# Patient Record
Sex: Male | Born: 1940 | Race: White | Hispanic: No | Marital: Married | Smoking: Former smoker
Health system: Southern US, Community
[De-identification: ages and names within clinical notes are randomized; demographics above are authoritative.]

## PROBLEM LIST (undated history)

## (undated) DIAGNOSIS — E785 Hyperlipidemia, unspecified: Secondary | ICD-10-CM

## (undated) DIAGNOSIS — E119 Type 2 diabetes mellitus without complications: Secondary | ICD-10-CM

## (undated) DIAGNOSIS — I10 Essential (primary) hypertension: Secondary | ICD-10-CM

## (undated) DIAGNOSIS — M1990 Unspecified osteoarthritis, unspecified site: Secondary | ICD-10-CM

## (undated) DIAGNOSIS — C4490 Unspecified malignant neoplasm of skin, unspecified: Secondary | ICD-10-CM

## (undated) DIAGNOSIS — K8689 Other specified diseases of pancreas: Secondary | ICD-10-CM

## (undated) HISTORY — DX: Unspecified malignant neoplasm of skin, unspecified: C44.90

## (undated) HISTORY — DX: Other specified diseases of pancreas: K86.89

## (undated) HISTORY — PX: TONSILLECTOMY AND ADENOIDECTOMY: SUR1326

## (undated) HISTORY — PX: LAMINECTOMY: SHX219

## (undated) HISTORY — PX: HEMORRHOID SURGERY: SHX153

---

## 2000-09-04 ENCOUNTER — Observation Stay (HOSPITAL_COMMUNITY): Admission: RE | Admit: 2000-09-04 | Discharge: 2000-09-05 | Payer: Self-pay | Admitting: Otolaryngology

## 2005-05-24 ENCOUNTER — Ambulatory Visit (HOSPITAL_COMMUNITY): Admission: RE | Admit: 2005-05-24 | Discharge: 2005-05-25 | Payer: Self-pay | Admitting: Neurological Surgery

## 2007-03-31 DIAGNOSIS — N529 Male erectile dysfunction, unspecified: Secondary | ICD-10-CM

## 2015-06-01 ENCOUNTER — Other Ambulatory Visit: Payer: Self-pay | Admitting: Family Medicine

## 2015-09-02 ENCOUNTER — Other Ambulatory Visit: Payer: Self-pay | Admitting: Family Medicine

## 2015-09-12 DIAGNOSIS — IMO0001 Reserved for inherently not codable concepts without codable children: Secondary | ICD-10-CM

## 2015-09-12 DIAGNOSIS — M48061 Spinal stenosis, lumbar region without neurogenic claudication: Secondary | ICD-10-CM | POA: Insufficient documentation

## 2015-09-12 DIAGNOSIS — H9313 Tinnitus, bilateral: Secondary | ICD-10-CM

## 2015-09-12 DIAGNOSIS — R351 Nocturia: Secondary | ICD-10-CM | POA: Insufficient documentation

## 2015-09-12 DIAGNOSIS — N400 Enlarged prostate without lower urinary tract symptoms: Secondary | ICD-10-CM | POA: Insufficient documentation

## 2015-09-12 DIAGNOSIS — E785 Hyperlipidemia, unspecified: Secondary | ICD-10-CM | POA: Insufficient documentation

## 2015-09-12 DIAGNOSIS — K648 Other hemorrhoids: Secondary | ICD-10-CM | POA: Insufficient documentation

## 2015-09-12 DIAGNOSIS — E119 Type 2 diabetes mellitus without complications: Secondary | ICD-10-CM | POA: Insufficient documentation

## 2015-09-12 DIAGNOSIS — M47812 Spondylosis without myelopathy or radiculopathy, cervical region: Secondary | ICD-10-CM

## 2015-09-12 DIAGNOSIS — R030 Elevated blood-pressure reading, without diagnosis of hypertension: Secondary | ICD-10-CM

## 2015-09-13 ENCOUNTER — Ambulatory Visit (INDEPENDENT_AMBULATORY_CARE_PROVIDER_SITE_OTHER): Payer: Medicare Other | Admitting: Family Medicine

## 2015-09-13 ENCOUNTER — Encounter: Payer: Self-pay | Admitting: Family Medicine

## 2015-09-13 ENCOUNTER — Other Ambulatory Visit: Payer: Self-pay

## 2015-09-13 VITALS — BP 136/74 | HR 81 | Temp 97.6°F | Resp 18 | Wt 160.2 lb

## 2015-09-13 DIAGNOSIS — E785 Hyperlipidemia, unspecified: Secondary | ICD-10-CM | POA: Diagnosis not present

## 2015-09-13 DIAGNOSIS — R195 Other fecal abnormalities: Secondary | ICD-10-CM | POA: Diagnosis not present

## 2015-09-13 DIAGNOSIS — E119 Type 2 diabetes mellitus without complications: Secondary | ICD-10-CM

## 2015-09-13 DIAGNOSIS — S90221A Contusion of right lesser toe(s) with damage to nail, initial encounter: Secondary | ICD-10-CM

## 2015-09-13 DIAGNOSIS — M47812 Spondylosis without myelopathy or radiculopathy, cervical region: Secondary | ICD-10-CM | POA: Insufficient documentation

## 2015-09-13 NOTE — Progress Notes (Signed)
Subjective:    Patient ID: Jared Paul, male    DOB: 01-02-1941, 74 y.o.   MRN: [id number] Chief Complaint  Patient presents with  . Blood Sugar Problem    Elevated FBS  . Blood In Stools    Positive FOBT on 08/25/2015    HPI  This 74 year old male diabetic had the [employer] nurse come by his house and checked stool for blood. Received a report it was positive on 08-25-15. Denies hematemesis, melena or hematochezia. Occasional hard stool with left abdominal dull ache. Last colonoscopy was 11 years ago without significant anomaly. Has a history of hemorrhoidectomy in 1974 without recurrences. Also, notice blood sugars have been 130-217 the past month to 6 weeks. No polyuria or polydipsia. Still on the Onglyza 5 mg qd with Metformin 500 mg 1 q am and 2 q pm. Has appointment on 09-15-15 for eye exam. Some decrease in sensation in the tip of the second toe of the right foot. Thinks he may have bumped it recently.  History reviewed. No pertinent past medical history. Patient Active Problem List   Diagnosis Date Noted  . Arthritis of neck 09/13/2015  . Nocturia 09/12/2015  . Hemorrhoids, internal 09/12/2015  . Spinal stenosis, lumbar 09/12/2015  . Hyperlipidemia 09/12/2015  . Elevated blood pressure 09/12/2015  . Type 2 diabetes mellitus without complication 44/02/4741  . Cervical arthritis 09/12/2015  . Benign prostate hyperplasia 09/12/2015  . 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 ED (erectile dysfunction) of organic origin 03/31/2007   Past Surgical History  Procedure Laterality Date  . Hemorrhoid surgery    . Tonsillectomy and adenoidectomy    . Laminectomy     Family History  Problem Relation Age of Onset  . Diabetes Mother   . Hypertension Mother   . Stroke Mother   . Lung cancer Father   . Pancreatic cancer Father   . Diabetes Father   . Multiple sclerosis Brother   . Diabetes Brother   . Pneumonia Maternal Grandmother   . Pneumonia Maternal Grandfather   .  Heart failure Paternal Grandmother   . Healthy Brother    Allergies  Allergen Reactions  . Prednisone     ELEVATED BLOOD SUGAR   Current Outpatient Prescriptions on File Prior to Visit  Medication Sig Dispense Refill  . aspirin 81 MG tablet Take 81 mg by mouth daily.    . Cinnamon 500 MG capsule Take 2,000 mg by mouth daily.    Marland Kitchen doxazosin (CARDURA) 4 MG tablet Take 4 mg by mouth daily.    . meloxicam (MOBIC) 15 MG tablet Take 15 mg by mouth daily.    . metFORMIN (GLUMETZA) 500 MG (MOD) 24 hr tablet Take 500 mg by mouth daily with breakfast. TAKE 2 TABLETS EVERY EVENING    . Omega-3 Fatty Acids (FISH OIL) 1000 MG CAPS Take 4 capsules by mouth.    . ONGLYZA 5 MG TABS tablet TAKE ONE TABLET BY MOUTH ONCE DAILY 90 tablet 3  . simvastatin (ZOCOR) 20 MG tablet Take 20 mg by mouth daily.     No current facility-administered medications on file prior to visit.   Social History  Substance Use Topics  . Smoking status: Former Research scientist (life sciences)  . Smokeless tobacco: Never Used  . Alcohol Use: No   Review of Systems  Constitutional: Negative.   HENT: Negative.   Respiratory: Negative.   Cardiovascular: Negative.   Gastrointestinal:       Some slight constipation occasionally. Normally has 2-3  soft stools a day. No blood in stools but has a history of hemorrhoids.  Genitourinary:       Nocturia 1-2 times a night without hematuria or dysuria. Slight decrease in stream with hesitancy (start-and-stop flow).     BP 136/74 mmHg  Pulse 81  Temp(Src) 97.6 F (36.4 C) (Oral)  Resp 18  Wt 160 lb 3.2 oz (72.666 kg)  SpO2 98%  Objective:   Physical Exam  Constitutional: He is oriented to person, place, and time. He appears well-developed and well-nourished. No distress.  HENT:  Head: Normocephalic and atraumatic.  Right Ear: Hearing normal.  Left Ear: Hearing normal.  Nose: Nose normal.  Eyes: Conjunctivae and lids are normal. Right eye exhibits no discharge. Left eye exhibits no discharge. No  scleral icterus.  Neck:  Congenital cervical stiffness and tilt of head to the right. History of some arthritic changes on past x-rays.  Cardiovascular: Normal rate and regular rhythm.   Pulmonary/Chest: Effort normal. No respiratory distress.  Abdominal: Soft. Bowel sounds are normal. He exhibits no distension and no mass. There is no tenderness. There is no guarding.  Musculoskeletal: Normal range of motion.  Neurological: He is alert and oriented to person, place, and time.  Skin: Skin is intact. No lesion and no rash noted.  Psychiatric: He has a normal mood and affect. His speech is normal and behavior is normal. Thought content normal.      Assessment & Plan:   1. Occult blood in stools Positive stool test for occult blood. No hematochezia or abdominal pain. Will schedule for gastroenterology referral for colonoscopy. - Ambulatory referral to Gastroenterology  2. Type 2 diabetes mellitus without complication Tolerating Onglyza and Metformin without side effects. Some tingling in the tip of the right second toe suspected secondary to trauma. Will continue diabetic diet and check routine labs. - CBC with Differential/Platelet - Hemoglobin A1c  3. Hyperlipidemia Tolerating Zocor without side effects. Continue low fat diet and recheck labs. - COMPLETE METABOLIC PANEL WITH GFR - Lipid panel  4. Subungual hematoma of toe of right foot, initial encounter Recent trauma with a dark spot in the right second toenail. Observe for changes. Expect slow resolution over the next month. No pain today.

## 2015-09-15 ENCOUNTER — Encounter: Payer: Self-pay | Admitting: Family Medicine

## 2015-09-15 DIAGNOSIS — R1031 Right lower quadrant pain: Secondary | ICD-10-CM | POA: Diagnosis not present

## 2015-09-15 DIAGNOSIS — R1032 Left lower quadrant pain: Secondary | ICD-10-CM | POA: Diagnosis not present

## 2015-09-15 DIAGNOSIS — K5900 Constipation, unspecified: Secondary | ICD-10-CM | POA: Diagnosis not present

## 2015-09-15 DIAGNOSIS — E785 Hyperlipidemia, unspecified: Secondary | ICD-10-CM | POA: Diagnosis not present

## 2015-09-15 DIAGNOSIS — E119 Type 2 diabetes mellitus without complications: Secondary | ICD-10-CM | POA: Diagnosis not present

## 2015-09-15 DIAGNOSIS — R195 Other fecal abnormalities: Secondary | ICD-10-CM | POA: Diagnosis not present

## 2015-09-16 LAB — COMPREHENSIVE METABOLIC PANEL
A/G RATIO: 2.3 (ref 1.1–2.5)
ALK PHOS: 38 IU/L — AB (ref 39–117)
ALT: 17 IU/L (ref 0–44)
AST: 16 IU/L (ref 0–40)
Albumin: 4.5 g/dL (ref 3.5–4.8)
BILIRUBIN TOTAL: 0.9 mg/dL (ref 0.0–1.2)
BUN/Creatinine Ratio: 21 (ref 10–22)
BUN: 16 mg/dL (ref 8–27)
CO2: 23 mmol/L (ref 18–29)
Calcium: 9.4 mg/dL (ref 8.6–10.2)
Chloride: 100 mmol/L (ref 97–108)
Creatinine, Ser: 0.76 mg/dL (ref 0.76–1.27)
GFR calc Af Amer: 105 mL/min/{1.73_m2} (ref >59)
GFR calc non Af Amer: 91 mL/min/{1.73_m2} (ref >59)
GLOBULIN, TOTAL: 2 g/dL (ref 1.5–4.5)
Glucose: 238 mg/dL — ABNORMAL HIGH (ref 65–99)
POTASSIUM: 4.4 mmol/L (ref 3.5–5.2)
SODIUM: 139 mmol/L (ref 134–144)
Total Protein: 6.5 g/dL (ref 6.0–8.5)

## 2015-09-16 LAB — CBC WITH DIFFERENTIAL/PLATELET
BASOS: 0 %
Basophils Absolute: 0 10*3/uL (ref 0.0–0.2)
EOS (ABSOLUTE): 0.2 10*3/uL (ref 0.0–0.4)
EOS: 5 %
HEMATOCRIT: 45.2 % (ref 37.5–51.0)
Hemoglobin: 15.3 g/dL (ref 12.6–17.7)
IMMATURE GRANS (ABS): 0 10*3/uL (ref 0.0–0.1)
IMMATURE GRANULOCYTES: 0 %
LYMPHS: 24 %
Lymphocytes Absolute: 1.2 10*3/uL (ref 0.7–3.1)
MCH: 30.3 pg (ref 26.6–33.0)
MCHC: 33.8 g/dL (ref 31.5–35.7)
MCV: 90 fL (ref 79–97)
MONOS ABS: 0.4 10*3/uL (ref 0.1–0.9)
Monocytes: 9 %
NEUTROS ABS: 3 10*3/uL (ref 1.4–7.0)
NEUTROS PCT: 62 %
Platelets: 133 10*3/uL — ABNORMAL LOW (ref 150–379)
RBC: 5.05 x10E6/uL (ref 4.14–5.80)
RDW: 13.4 % (ref 12.3–15.4)
WBC: 4.9 10*3/uL (ref 3.4–10.8)

## 2015-09-16 LAB — LIPID PANEL
CHOLESTEROL TOTAL: 129 mg/dL (ref 100–199)
Chol/HDL Ratio: 2.5 ratio units (ref 0.0–5.0)
HDL: 51 mg/dL (ref >39)
LDL CALC: 56 mg/dL (ref 0–99)
TRIGLYCERIDES: 110 mg/dL (ref 0–149)
VLDL CHOLESTEROL CAL: 22 mg/dL (ref 5–40)

## 2015-09-16 LAB — HEMOGLOBIN A1C
Est. average glucose Bld gHb Est-mCnc: 174 mg/dL
Hgb A1c MFr Bld: 7.7 % — ABNORMAL HIGH (ref 4.8–5.6)

## 2015-09-19 ENCOUNTER — Telehealth: Payer: Self-pay

## 2015-09-19 NOTE — Telephone Encounter (Signed)
-----   Message from Margo Common, [place] sent at 09/16/2015  6:49 PM EDT ----- Blood tests normal except blood sugar high at 238 and Hgb A1C high at 7.7. Normal cholesterol levels and no liver or kidney disease. Continue Onglyza 5 mg qd and increase Metformin to 1000 mg BID. Should check FBS daily and schedule follow up appointment in a month.

## 2015-09-19 NOTE — Telephone Encounter (Signed)
Patient advised as directed below. Patient verbalized understanding and agrees with treatment plan. Patient scheduled for 1 month follow up appointment.

## 2015-10-04 ENCOUNTER — Encounter: Payer: Self-pay | Admitting: Family Medicine

## 2015-10-06 ENCOUNTER — Other Ambulatory Visit: Payer: Self-pay | Admitting: Family Medicine

## 2015-10-06 ENCOUNTER — Encounter: Payer: Self-pay | Admitting: *Deleted

## 2015-10-06 DIAGNOSIS — E785 Hyperlipidemia, unspecified: Secondary | ICD-10-CM

## 2015-10-07 ENCOUNTER — Ambulatory Visit: Payer: Medicare Other | Admitting: Anesthesiology

## 2015-10-07 ENCOUNTER — Ambulatory Visit
Admission: RE | Admit: 2015-10-07 | Discharge: 2015-10-07 | Disposition: A | Discharge status: Home / Self Care | Payer: Medicare Other | Source: Ambulatory Visit | Attending: Gastroenterology | Admitting: Gastroenterology

## 2015-10-07 ENCOUNTER — Encounter: Payer: Self-pay | Admitting: *Deleted

## 2015-10-07 ENCOUNTER — Other Ambulatory Visit: Payer: Self-pay | Admitting: Family Medicine

## 2015-10-07 ENCOUNTER — Encounter
Admission: RE | Disposition: A | Discharge status: Home / Self Care | Payer: Self-pay | Source: Ambulatory Visit | Attending: Gastroenterology

## 2015-10-07 DIAGNOSIS — I10 Essential (primary) hypertension: Secondary | ICD-10-CM | POA: Insufficient documentation

## 2015-10-07 DIAGNOSIS — Z800 Family history of malignant neoplasm of digestive organs: Secondary | ICD-10-CM | POA: Diagnosis not present

## 2015-10-07 DIAGNOSIS — M1990 Unspecified osteoarthritis, unspecified site: Secondary | ICD-10-CM | POA: Insufficient documentation

## 2015-10-07 DIAGNOSIS — Z7982 Long term (current) use of aspirin: Secondary | ICD-10-CM | POA: Diagnosis not present

## 2015-10-07 DIAGNOSIS — E119 Type 2 diabetes mellitus without complications: Secondary | ICD-10-CM | POA: Diagnosis not present

## 2015-10-07 DIAGNOSIS — K5730 Diverticulosis of large intestine without perforation or abscess without bleeding: Secondary | ICD-10-CM | POA: Insufficient documentation

## 2015-10-07 DIAGNOSIS — E785 Hyperlipidemia, unspecified: Secondary | ICD-10-CM | POA: Diagnosis not present

## 2015-10-07 DIAGNOSIS — K5790 Diverticulosis of intestine, part unspecified, without perforation or abscess without bleeding: Secondary | ICD-10-CM | POA: Diagnosis not present

## 2015-10-07 DIAGNOSIS — Z87891 Personal history of nicotine dependence: Secondary | ICD-10-CM | POA: Insufficient documentation

## 2015-10-07 DIAGNOSIS — R195 Other fecal abnormalities: Secondary | ICD-10-CM | POA: Diagnosis present

## 2015-10-07 DIAGNOSIS — Z888 Allergy status to other drugs, medicaments and biological substances status: Secondary | ICD-10-CM | POA: Insufficient documentation

## 2015-10-07 HISTORY — DX: Hyperlipidemia, unspecified: E78.5

## 2015-10-07 HISTORY — DX: Type 2 diabetes mellitus without complications: E11.9

## 2015-10-07 HISTORY — DX: Essential (primary) hypertension: I10

## 2015-10-07 HISTORY — PX: COLONOSCOPY WITH PROPOFOL: SHX5780

## 2015-10-07 LAB — GLUCOSE, CAPILLARY: GLUCOSE-CAPILLARY: 182 mg/dL — AB (ref 65–99)

## 2015-10-07 SURGERY — COLONOSCOPY WITH PROPOFOL
Anesthesia: General

## 2015-10-07 MED ORDER — MIDAZOLAM HCL 2 MG/2ML IJ SOLN
INTRAMUSCULAR | Status: DC | PRN
  Administered 2015-10-07: 1 mg via INTRAVENOUS

## 2015-10-07 MED ORDER — GLUCOSE BLOOD VI STRP: ORAL_STRIP | Status: AC

## 2015-10-07 MED ORDER — SODIUM CHLORIDE 0.9 % IV SOLN
INTRAVENOUS | Status: DC
  Administered 2015-10-07: 10:00:00 via INTRAVENOUS

## 2015-10-07 MED ORDER — FENTANYL CITRATE (PF) 100 MCG/2ML IJ SOLN
INTRAMUSCULAR | Status: DC | PRN
  Administered 2015-10-07: 50 ug via INTRAVENOUS

## 2015-10-07 MED ORDER — PROPOFOL 500 MG/50ML IV EMUL
INTRAVENOUS | Status: DC | PRN
  Administered 2015-10-07: 120 ug/kg/min via INTRAVENOUS

## 2015-10-07 NOTE — Transfer of Care (Signed)
Immediate Anesthesia Transfer of Care Note  Patient: Jared Paul  Procedure(s) Performed: Procedure(s): COLONOSCOPY WITH PROPOFOL (N/A)  Patient Location: PACU  Anesthesia Type:General  Level of Consciousness: awake and sedated  Airway & Oxygen Therapy: Patient Spontanous Breathing and Patient connected to nasal cannula oxygen  Post-op Assessment: Report given to RN and Post -op Vital signs reviewed and stable  Post vital signs: Reviewed and stable  Last Vitals:  Filed Vitals:   10/07/15 0959  BP: 162/79  Pulse: 70  Temp: 36.8 C  Resp: 10    Complications: No apparent anesthesia complications

## 2015-10-07 NOTE — Discharge Instructions (Signed)

## 2015-10-07 NOTE — Op Note (Signed)
[facility] Patient Name: Jared Paul Procedure Date: 10/07/2015 10:36 AM MRN: [id number] Account #: [id number] Date of Birth: [birth year] Admit Type: Outpatient Age: 74 Room: [facility] ENDO ROOM 2 Gender: Male Note Status: Finalized Procedure:         Colonoscopy Indications:       Last colonoscopy 10 years ago, Heme positive stool Patient Profile:   This is a 74 year old male. Providers:         Gerrit Heck. Rayann Heman, MD Referring MD:      Guadalupe Maple, MD (Referring MD) Medicines:         Propofol per Anesthesia Complications:     No immediate complications. Procedure:         Pre-Anesthesia Assessment:                    - Prior to the procedure, a History and Physical was                     performed, and patient medications, allergies and                     sensitivities were reviewed. The patient's tolerance of                     previous anesthesia was reviewed.                    After obtaining informed consent, the colonoscope was                     passed under direct vision. Throughout the procedure, the                     patient's blood pressure, pulse, and oxygen saturations                     were monitored continuously. The Colonoscope was                     introduced through the anus and advanced to the the cecum,                     identified by appendiceal orifice and ileocecal valve. The                     colonoscopy was performed without difficulty. The patient                     tolerated the procedure well. The quality of the bowel                     preparation was excellent. Findings:      The perianal and digital rectal examinations were normal.      A few small and large-mouthed diverticula were found in the sigmoid       colon.      The exam was otherwise without abnormality on direct and retroflexion       views. Impression:        - Diverticulosis in the sigmoid colon.                    - The examination  was otherwise normal on direct and  retroflexion views.                    - No specimens collected. Recommendation:    - Observe patient in GI recovery unit.                    - High fiber diet.                    - Continue present medications.                    - Return to referring physician.                    - Consider stool cards starting in 5 years but would not                     plan for further screening colonoscopies.                    - The findings and recommendations were discussed with the                     patient.                    - The findings and recommendations were discussed with the                     patient's family. Procedure Code(s): --- Professional ---                    [id number], Colonoscopy, flexible; diagnostic, including                     collection of specimen(s) by brushing or washing, when                     performed (separate procedure) CPT copyright 2014 American Medical Association. All rights reserved. The codes documented in this report are preliminary and upon coder review may  be revised to meet current compliance requirements. Mellody Life, MD 10/07/2015 11:04:57 AM This report has been signed electronically. Number of Addenda: 0 Note Initiated On: 10/07/2015 10:36 AM Scope Withdrawal Time: 0 hours 11 minutes 15 seconds  Total Procedure Duration: 0 hours 18 minutes 14 seconds       [facility]

## 2015-10-07 NOTE — H&P (Signed)
Primary Care Physician:  Vernie Murders, PA  Pre-Procedure History & Physical: HPI:  Jared Paul is a 74 y.o. male is here for an colonoscopy.   Past Medical History  Diagnosis Date  . Diabetes mellitus without complication ([place])   . Hypertension   . Hyperlipidemia     Past Surgical History  Procedure Laterality Date  . Hemorrhoid surgery    . Tonsillectomy and adenoidectomy    . Laminectomy      Prior to Admission medications   Medication Sig Start Date End Date Taking? Authorizing Provider  Cinnamon 500 MG capsule Take 2,000 mg by mouth daily.   Yes Historical Provider, MD  doxazosin (CARDURA) 4 MG tablet Take 4 mg by mouth daily.   Yes Historical Provider, MD  meloxicam (MOBIC) 15 MG tablet Take 15 mg by mouth daily.   Yes Historical Provider, MD  metFORMIN (GLUMETZA) 500 MG (MOD) 24 hr tablet Take 500 mg by mouth daily with breakfast. TAKE 2 TABLETS EVERY EVENING   Yes Historical Provider, MD  Omega-3 Fatty Acids (FISH OIL) 1000 MG CAPS Take 4 capsules by mouth.   Yes Historical Provider, MD  ONGLYZA 5 MG TABS tablet TAKE ONE TABLET BY MOUTH ONCE DAILY 09/02/15  Yes Vickki Muff Chrismon, PA  simvastatin (ZOCOR) 20 MG tablet Take 1 tablet by mouth  every day 10/07/15  Yes Dennis E Chrismon, PA  aspirin 81 MG tablet Take 81 mg by mouth daily.    Historical Provider, MD  chlorpheniramine (ALLERGY) 4 MG tablet Take 1 tablet by mouth daily.    Historical Provider, MD  glucose blood (ACCU-CHEK AVIVA PLUS) test strip Use as instructed and check blood sugar once a day 10/07/15   Vickki Muff Chrismon, PA    Allergies as of 09/20/2015 - Review Complete 09/13/2015  Allergen Reaction Noted  . Prednisone  09/12/2015    Family History  Problem Relation Age of Onset  . Diabetes Mother   . Hypertension Mother   . Stroke Mother   . Lung cancer Father   . Pancreatic cancer Father   . Diabetes Father   . Multiple sclerosis Brother   . Diabetes Brother   . Pneumonia Maternal Grandmother    . Pneumonia Maternal Grandfather   . Heart failure Paternal Grandmother   . Healthy Brother     Social History   Social History  . Marital Status: Married    Spouse Name: N/A  . Number of Children: N/A  . Years of Education: N/A   Occupational History  . Not on file.   Social History Main Topics  . Smoking status: Former Research scientist (life sciences)  . Smokeless tobacco: Never Used  . Alcohol Use: No  . Drug Use: No  . Sexual Activity: Not on file   Other Topics Concern  . Not on file   Social History Narrative     Physical Exam: BP 162/79 mmHg  Pulse 70  Temp(Src) 98.3 F (36.8 C) (Tympanic)  Resp 10  Ht 5\' 10"  (1.778 m)  Wt 70.308 kg (155 lb)  BMI 22.24 kg/m2  SpO2 100% General:   Alert,  pleasant and cooperative in NAD Head:  Normocephalic and atraumatic. Neck:  Supple; no masses or thyromegaly. Lungs:  Clear throughout to auscultation.    Heart:  Regular rate and rhythm. Abdomen:  Soft, nontender and nondistended. Normal bowel sounds, without guarding, and without rebound.   Neurologic:  Alert and  oriented x4;  grossly normal neurologically.  Impression/Plan: Jared Paul is  here for an colonoscopy to be performed for + FOBT  Risks, benefits, limitations, and alternatives regarding  colonoscopy have been reviewed with the patient.  Questions have been answered.  All parties agreeable.   Josefine Class, MD  10/07/2015, 10:36 AM

## 2015-10-07 NOTE — Anesthesia Procedure Notes (Signed)
Performed by: COOK-MARTIN, Sada Mazzoni Pre-anesthesia Checklist: Patient identified, Emergency Drugs available, Suction available, Patient being monitored and Timeout performed Patient Re-evaluated:Patient Re-evaluated prior to inductionOxygen Delivery Method: Nasal cannula Preoxygenation: Pre-oxygenation with 100% oxygen Intubation Type: IV induction Airway Equipment and Method: Bite block Placement Confirmation: CO2 detector and positive ETCO2     

## 2015-10-07 NOTE — Anesthesia Postprocedure Evaluation (Signed)
  Anesthesia Post-op Note  Patient: Jared Paul  Procedure(s) Performed: Procedure(s): COLONOSCOPY WITH PROPOFOL (N/A)  Anesthesia type:General  Patient location: PACU  Post pain: Pain level controlled  Post assessment: Post-op Vital signs reviewed, Patient's Cardiovascular Status Stable, Respiratory Function Stable, Patent Airway and No signs of Nausea or vomiting  Post vital signs: Reviewed and stable  Last Vitals:  Filed Vitals:   10/07/15 1150  BP:   Pulse: 63  Temp:   Resp: 15    Level of consciousness: awake, alert  and patient cooperative  Complications: No apparent anesthesia complications

## 2015-10-07 NOTE — Anesthesia Preprocedure Evaluation (Signed)
Anesthesia Evaluation  Patient identified by MRN, date of birth, ID band Patient awake    Reviewed: Allergy & Precautions, H&P , NPO status , Patient's Chart, lab work & pertinent test results  History of Anesthesia Complications Negative for: history of anesthetic complications  Airway Mallampati: III  TM Distance: >3 FB Neck ROM: limited    Dental  (+) Poor Dentition, Missing, Partial Lower   Pulmonary neg shortness of breath, former smoker,    Pulmonary exam normal breath sounds clear to auscultation       Cardiovascular Exercise Tolerance: Good hypertension, (-) Past MI Normal cardiovascular exam Rhythm:regular Rate:Normal     Neuro/Psych negative neurological ROS  negative psych ROS   GI/Hepatic negative GI ROS, Neg liver ROS,   Endo/Other  diabetes, Type 2, Oral Hypoglycemic Agents  Renal/GU negative Renal ROS  negative genitourinary   Musculoskeletal  (+) Arthritis ,   Abdominal   Peds  Hematology negative hematology ROS (+)   Anesthesia Other Findings Past Medical History:   Diabetes mellitus without complication (HCC)                 Hypertension                                                 Hyperlipidemia                                               Reproductive/Obstetrics negative OB ROS                             Anesthesia Physical Anesthesia Plan  ASA: III  Anesthesia Plan: General   Post-op Pain Management:    Induction:   Airway Management Planned:   Additional Equipment:   Intra-op Plan:   Post-operative Plan:   Informed Consent: I have reviewed the patients History and Physical, chart, labs and discussed the procedure including the risks, benefits and alternatives for the proposed anesthesia with the patient or authorized representative who has indicated his/her understanding and acceptance.   Dental Advisory Given  Plan Discussed with:  Anesthesiologist, CRNA and Surgeon  Anesthesia Plan Comments:         Anesthesia Quick Evaluation

## 2015-10-10 ENCOUNTER — Encounter: Payer: Self-pay | Admitting: Gastroenterology

## 2015-10-12 DIAGNOSIS — G4730 Sleep apnea, unspecified: Secondary | ICD-10-CM | POA: Insufficient documentation

## 2015-10-20 ENCOUNTER — Ambulatory Visit (INDEPENDENT_AMBULATORY_CARE_PROVIDER_SITE_OTHER): Payer: Medicare Other | Admitting: Family Medicine

## 2015-10-20 ENCOUNTER — Other Ambulatory Visit: Payer: Self-pay

## 2015-10-20 ENCOUNTER — Encounter: Payer: Self-pay | Admitting: Family Medicine

## 2015-10-20 VITALS — BP 118/64 | HR 90 | Temp 97.9°F | Resp 16 | Wt 160.8 lb

## 2015-10-20 DIAGNOSIS — E119 Type 2 diabetes mellitus without complications: Secondary | ICD-10-CM

## 2015-10-20 DIAGNOSIS — Z23 Encounter for immunization: Secondary | ICD-10-CM

## 2015-10-20 DIAGNOSIS — M4692 Unspecified inflammatory spondylopathy, cervical region: Secondary | ICD-10-CM

## 2015-10-20 DIAGNOSIS — M47812 Spondylosis without myelopathy or radiculopathy, cervical region: Secondary | ICD-10-CM

## 2015-10-20 MED ORDER — PIOGLITAZONE HCL 15 MG PO TABS: 15.0000 mg | ORAL_TABLET | Freq: Every day | ORAL | Status: DC

## 2015-10-20 NOTE — Addendum Note (Signed)
Addended by: Jules Schick on: 10/20/2015 09:03 AM   Modules accepted: Orders

## 2015-10-20 NOTE — Progress Notes (Signed)
Patient ID: Jared Paul, male   DOB: October 09, 1941, 74 y.o.   MRN: [id number]    Subjective:  Diabetes He presents for his follow-up diabetic visit. He has type 2 diabetes mellitus. His disease course has been worsening. There are no hypoglycemic associated symptoms. Pertinent negatives for diabetes include no blurred vision, no polydipsia and no polyuria. There are no hypoglycemic complications. Symptoms are stable. Current diabetic treatment includes oral agent (dual therapy) and diet. He is compliant with treatment most of the time. His weight is decreasing steadily. He is following a diabetic diet. He participates in exercise intermittently. His breakfast blood glucose is taken between 5-6 am. His breakfast blood glucose range is generally >200 mg/dl. His overall blood glucose range is >200 mg/dl. Eye exam is current.     Prior to Admission medications   Medication Sig Start Date End Date Taking? Authorizing Provider  aspirin 81 MG tablet Take 81 mg by mouth daily.   Yes Historical Provider, MD  chlorpheniramine (ALLERGY) 4 MG tablet Take 1 tablet by mouth daily.   Yes Historical Provider, MD  Cinnamon 500 MG capsule Take 2,000 mg by mouth daily.   Yes Historical Provider, MD  doxazosin (CARDURA) 4 MG tablet Take 4 mg by mouth daily.   Yes Historical Provider, MD  GAVILYTE-N WITH FLAVOR PACK 420 G solution  10/05/15  Yes Historical Provider, MD  glucose blood (ACCU-CHEK AVIVA PLUS) test strip Use as instructed and check blood sugar once a day 10/07/15  Yes Dennis E Chrismon, PA  meloxicam (MOBIC) 15 MG tablet Take 15 mg by mouth daily.   Yes Historical Provider, MD  metFORMIN (GLUMETZA) 500 MG (MOD) 24 hr tablet Take 500 mg by mouth daily with breakfast. TAKE 2 TABLETS EVERY EVENING   Yes Historical Provider, MD  Omega-3 Fatty Acids (FISH OIL) 1000 MG CAPS Take 4 capsules by mouth.   Yes Historical Provider, MD  ONGLYZA 5 MG TABS tablet TAKE ONE TABLET BY MOUTH ONCE DAILY 09/02/15  Yes Vickki Muff  Chrismon, PA  simvastatin (ZOCOR) 20 MG tablet Take 1 tablet by mouth  every day 10/07/15  Yes Margo Common, PA   Past Surgical History  Procedure Laterality Date  . Hemorrhoid surgery    . Tonsillectomy and adenoidectomy    . Laminectomy    . Colonoscopy with propofol N/A 10/07/2015    Procedure: COLONOSCOPY WITH PROPOFOL;  Surgeon: Josefine Class, MD;  Location: [facility] ENDOSCOPY;  Service: Endoscopy;  Laterality: N/A;    Patient Active Problem List   Diagnosis Date Noted  . Apnea, sleep 10/12/2015  . Arthritis of neck ([place]) 09/13/2015  . Nocturia 09/12/2015  . Hemorrhoids, internal 09/12/2015  . Spinal stenosis, lumbar 09/12/2015  . Hyperlipidemia 09/12/2015  . Elevated blood pressure 09/12/2015  . Type 2 diabetes mellitus without complication ([place]) 85/88/5027  . Cervical arthritis ([place]) 09/12/2015  . Benign prostate hyperplasia 09/12/2015  . 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 Diabetes mellitus without complication ([place])   . Hypertension   . Hyperlipidemia     Social History   Social History  . Marital Status: Married    Spouse Name: N/A  . Number of Children: N/A  . Years of Education: N/A   Occupational History  . Not on file.   Social History Main Topics  . Smoking status: Former Research scientist (life sciences)  . Smokeless tobacco: Never Used  . Alcohol Use:  No  . Drug Use: No  . Sexual Activity: Not on file   Other Topics Concern  . Not on file   Social History Narrative    Allergies  Allergen Reactions  . Prednisone     ELEVATED BLOOD SUGAR    Review of Systems  Constitutional: Negative.   HENT: Negative.   Eyes: Negative.  Negative for blurred vision.  Respiratory: Negative.   Cardiovascular: Negative.   Gastrointestinal: Negative.   Genitourinary: Negative.   Musculoskeletal: Negative.   Skin: Negative.   Neurological: Negative.   Endo/Heme/Allergies:  Negative.  Negative for polydipsia.  Psychiatric/Behavioral: Negative.     Immunization History  Administered Date(s) Administered  . Pneumococcal Conjugate-13 11/12/2014  . Pneumococcal Polysaccharide-23 11/16/2013  . Td 05/28/2007  . Zoster 11/21/2012   Objective:  BP 118/64 mmHg  Pulse 90  Temp(Src) 97.9 F (36.6 C) (Oral)  Resp 16  Wt 160 lb 12.8 oz (72.938 kg)  SpO2 95%  Physical Exam  Constitutional: He is oriented to person, place, and time and well-developed, well-nourished, and in no distress.  HENT:  Head: Normocephalic.  Right Ear: External ear normal.  Left Ear: External ear normal.  Nose: Nose normal.  Eyes: Conjunctivae and EOM are normal.  Neck:  Stiff with very limited range of motion. Head tilted to the right. No pain or radiation today. No extremity weakness.  Neurological: He is alert and oriented to person, place, and time.  Psychiatric: Affect and judgment normal.    Lab Results  Component Value Date   WBC 4.9 09/15/2015   HCT 45.2 09/15/2015   GLUCOSE 238* 09/15/2015   CHOL 129 09/15/2015   TRIG 110 09/15/2015   HDL 51 09/15/2015   LDLCALC 56 09/15/2015   HGBA1C 7.7* 09/15/2015    CMP     Component Value Date/Time   NA 139 09/15/2015 0814   K 4.4 09/15/2015 0814   CL 100 09/15/2015 0814   CO2 23 09/15/2015 0814   GLUCOSE 238* 09/15/2015 0814   BUN 16 09/15/2015 0814   CREATININE 0.76 09/15/2015 0814   CALCIUM 9.4 09/15/2015 0814   PROT 6.5 09/15/2015 0814   ALBUMIN 4.5 09/15/2015 0814   AST 16 09/15/2015 0814   ALT 17 09/15/2015 0814   ALKPHOS 38* 09/15/2015 0814   BILITOT 0.9 09/15/2015 0814   GFRNONAA 91 09/15/2015 0814   GFRAA 105 09/15/2015 0814    Assessment and Plan :  1. Type 2 diabetes mellitus without complication, without long-term current use of insulin (HCC) Steady increase in Hgb A1C over the past year from 5.6 to 7.7. Having elevation of FBS to over 200 regularly now. Still on the Onglyza 5 mg qd and Metformin  500 mg 2 BID with diabetic diet. Denies hypoglycemic episodes, polyuria, polydipsia or vision changes. Will add Actos and recheck in a month.   2. Cervical arthritis (HCC) Stable and unchanged. Tolerating Meloxicam daily without side effect and controls pain very well. Recheck prn. [facility] 10/20/2015 8:10 AM

## 2015-11-21 ENCOUNTER — Encounter: Payer: Self-pay | Admitting: Family Medicine

## 2015-11-21 ENCOUNTER — Ambulatory Visit (INDEPENDENT_AMBULATORY_CARE_PROVIDER_SITE_OTHER): Payer: Medicare Other | Admitting: Family Medicine

## 2015-11-21 VITALS — BP 138/70 | HR 77 | Temp 97.8°F | Resp 16 | Wt 163.4 lb

## 2015-11-21 DIAGNOSIS — L02429 Furuncle of limb, unspecified: Secondary | ICD-10-CM

## 2015-11-21 DIAGNOSIS — E119 Type 2 diabetes mellitus without complications: Secondary | ICD-10-CM | POA: Diagnosis not present

## 2015-11-21 MED ORDER — CEPHALEXIN 500 MG PO CAPS: 500.0000 mg | ORAL_CAPSULE | Freq: Four times a day (QID) | ORAL | Status: DC

## 2015-11-21 MED ORDER — INSULIN GLARGINE 100 UNIT/ML SOLOSTAR PEN: 6.0000 [IU] | PEN_INJECTOR | Freq: Every day | SUBCUTANEOUS | Status: DC

## 2015-11-21 MED ORDER — INSULIN GLARGINE 100 UNIT/ML SOLOSTAR PEN: 10.0000 [IU] | PEN_INJECTOR | Freq: Every day | SUBCUTANEOUS | Status: DC

## 2015-11-21 NOTE — Progress Notes (Signed)
Patient ID: Jared Paul, male   DOB: 1941-07-06, 74 y.o.   MRN: [id number]   Chief Complaint  Patient presents with  . Diabetes  . Follow-up    Subjective:  Diabetes He presents for his follow-up diabetic visit. He has type 2 diabetes mellitus. His disease course has been fluctuating (despite adding Actos 15 mg qd to the Metformin 500 mg 2 qd and Onglyza 5 mg qd on 10-20-15). There are no hypoglycemic associated symptoms. There are no diabetic associated symptoms. There are no hypoglycemic complications. Risk factors for coronary artery disease include hypertension, diabetes mellitus and male sex. Current diabetic treatment includes diet and oral agent (triple therapy). He is compliant with treatment most of the time. His weight is increasing steadily. Meal planning includes avoidance of concentrated sweets and calorie counting. He participates in exercise intermittently. His home blood glucose trend is increasing steadily. His breakfast blood glucose is taken between 6-7 am. His breakfast blood glucose range is generally >200 mg/dl. Eye exam is current.    Prior to Admission medications   Medication Sig Start Date End Date Taking? Authorizing Provider  aspirin 81 MG tablet Take 81 mg by mouth daily.   Yes Historical Provider, MD  chlorpheniramine (ALLERGY) 4 MG tablet Take 1 tablet by mouth daily.   Yes Historical Provider, MD  Cinnamon 500 MG capsule Take 2,000 mg by mouth daily.   Yes Historical Provider, MD  doxazosin (CARDURA) 4 MG tablet Take 4 mg by mouth daily.   Yes Historical Provider, MD  GAVILYTE-N WITH FLAVOR PACK 420 G solution  10/05/15  Yes Historical Provider, MD  glucose blood (ACCU-CHEK AVIVA PLUS) test strip Use as instructed and check blood sugar once a day 10/07/15  Yes Dennis E Chrismon, PA  meloxicam (MOBIC) 15 MG tablet Take 15 mg by mouth daily.   Yes Historical Provider, MD  metFORMIN (GLUMETZA) 500 MG (MOD) 24 hr tablet Take 500 mg by mouth daily with breakfast.  TAKE 2 TABLETS EVERY EVENING   Yes Historical Provider, MD  Omega-3 Fatty Acids (FISH OIL) 1000 MG CAPS Take 4 capsules by mouth.   Yes Historical Provider, MD  ONGLYZA 5 MG TABS tablet TAKE ONE TABLET BY MOUTH ONCE DAILY 09/02/15  Yes Dennis E Chrismon, PA  pioglitazone (ACTOS) 15 MG tablet Take 1 tablet (15 mg total) by mouth daily. 10/20/15  Yes Vickki Muff Chrismon, PA  simvastatin (ZOCOR) 20 MG tablet Take 1 tablet by mouth  every day 10/07/15  Yes Margo Common, PA    Patient Active Problem List   Diagnosis Date Noted  . Apnea, sleep 10/12/2015  . 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 Nocturia 09/12/2015  . Hemorrhoids, internal 09/12/2015  . Spinal stenosis, lumbar 09/12/2015  . Hyperlipidemia 09/12/2015  . Elevated blood pressure 09/12/2015  . Type 2 diabetes mellitus without complication ([place]) [id number]  . Cervical arthritis ([place]) 09/12/2015  . Benign prostate hyperplasia 09/12/2015  . 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 Diabetes mellitus without complication ([place])   . Hypertension   . Hyperlipidemia     Social History   Social History  . Marital Status: Married    Spouse Name: N/A  . Number of Children: N/A  . Years of Education: N/A   Occupational History  . Not on file.   Social History Main Topics  . Smoking status: Former Research scientist (life sciences)  . Smokeless tobacco: Never  Used  . Alcohol Use: No  . Drug Use: No  . Sexual Activity: Not on file   Other Topics Concern  . Not on file   Social History Narrative    Allergies  Allergen Reactions  . Prednisone     ELEVATED BLOOD SUGAR   Past Surgical History  Procedure Laterality Date  . Hemorrhoid surgery    . Tonsillectomy and adenoidectomy    . Laminectomy    . Colonoscopy with propofol N/A 10/07/2015    Procedure: COLONOSCOPY WITH PROPOFOL;  Surgeon: Josefine Class, MD;  Location[facility] END[facility]COPY;  Service:  Endoscopy;  Laterality: N/A;    Review of Systems  Constitutional: Negative.   HENT: Negative.   Eyes: Negative.   Respiratory: Negative.   Cardiovascular: Negative.   Gastrointestinal: Negative.   Genitourinary: Negative.   Musculoskeletal: Negative.   Skin: Negative.   Neurological: Negative.   Endo/Heme/Allergies: Negative.   Psychiatric/Behavioral: Negative.     Immunization History  Administered Date(s) Administered  . Influenza, High Dose Seasonal PF 10/20/2015  . Pneumococcal Conjugate-13 11/12/2014  . Pneumococcal Polysaccharide-23 11/16/2013  . Td 05/28/2007  . Zoster 11/21/2012   Objective:  BP 138/70 mmHg  Pulse 77  Temp(Src) 97.8 F (36.6 C) (Oral)  Resp 16  Wt 163 lb 6.4 oz (74.118 kg)  SpO2 98% Wt Readings from Last 3 Encounters:  11/21/15 163 lb 6.4 oz (74.118 kg)  10/20/15 160 lb 12.8 oz (72.938 kg)  10/07/15 155 lb (70.308 kg)    Physical Exam  Constitutional: He is oriented to person, place, and time and well-developed, well-nourished, and in no distress.  HENT:  Head: Normocephalic.  Right Ear: External ear normal.  Left Ear: External ear normal.  Mouth/Throat: Oropharynx is clear and moist.  Eyes: Conjunctivae and EOM are normal.  Neck: No thyromegaly present.  Stiff and limited ROM  Cardiovascular: Normal rate, regular rhythm and normal heart sounds.   Pulmonary/Chest: Effort normal and breath sounds normal.  Abdominal: Soft. Bowel sounds are normal.  Neurological: He is alert and oriented to person, place, and time. He has normal reflexes.  Skin:  Small boil right anterior thigh with slight soreness and 1 cm bright redness. No drainage but a central greenish scab.  Psychiatric: Memory, affect and judgment normal.    Lab Results  Component Value Date   WBC 4.9 09/15/2015   HCT 45.2 09/15/2015   GLUCOSE 238* 09/15/2015   CHOL 129 09/15/2015   TRIG 110 09/15/2015   HDL 51 09/15/2015   LDLCALC 56 09/15/2015   HGBA1C 7.7* 09/15/2015     CMP     Component Value Date/Time   NA 139 09/15/2015 0814   K 4.4 09/15/2015 0814   CL 100 09/15/2015 0814   CO2 23 09/15/2015 0814   GLUCOSE 238* 09/15/2015 0814   BUN 16 09/15/2015 0814   CREATININE 0.76 09/15/2015 0814   CALCIUM 9.4 09/15/2015 0814   PROT 6.5 09/15/2015 0814   ALBUMIN 4.5 09/15/2015 0814   AST 16 09/15/2015 0814   ALT 17 09/15/2015 0814   ALKPHOS 38* 09/15/2015 0814   BILITOT 0.9 09/15/2015 0814   GFRNONAA 91 09/15/2015 0814   GFRAA 105 09/15/2015 0814    Assessment and Plan :  1. Type 2 diabetes mellitus without complication, without long-term current use of insulin (HCC) FBS continues to range from 200 to 240 each morning the past month on the Onglyza 5 mg qd, Metformin 500 mg 2 BID and Actos 15 mg qd. Will  add 6 units of Lantus q am and recheck in 2 weeks. Demonstrated first injection today in the office. Continue to check FBS daily before injection. - Insulin Glargine (LANTUS SOLOSTAR) 100 UNIT/ML Solostar Pen; Inject 6 Units into the skin daily at 10 pm.  Dispense: 5 pen; Refill: PRN  2. Furuncle of thigh Onset over the past week. Will treat with antibiotic and recheck in 2 weeks. Clean skin with antibacterial soaps. - cephALEXin (KEFLEX) 500 MG capsule; Take 1 capsule (500 mg total) by mouth 4 (four) times daily.  Dispense: 30 capsule; Refill: [facility] 11/21/2015 8:04 AM

## 2015-11-21 NOTE — Patient Instructions (Signed)
Insulin Glargine injection What is this medicine? INSULIN GLARGINE (IN su lin GLAR geen) is a human-made form of insulin. This drug lowers the amount of sugar in your blood. It is a long-acting insulin that is usually given once a day. This medicine may be used for other purposes; ask your health care provider or pharmacist if you have questions. What should I tell my health care provider before I take this medicine? They need to know if you have any of these conditions: -episodes of hypoglycemia -kidney disease -liver disease -an unusual or allergic reaction to insulin, metacresol, other medicines, foods, dyes, or preservatives -pregnant or trying to get pregnant -breast-feeding How should I use this medicine? This medicine is for injection under the skin. Use this medicine at the same time each day. Use exactly as directed. This insulin should never be mixed in the same syringe with other insulins before injection. Do not vigorously shake before use. You will be taught how to use this medicine and how to adjust doses for activities and illness. Do not use more insulin than prescribed. Always check the appearance of your insulin before using it. This medicine should be clear and colorless like water. Do not use it if it is cloudy, thickened, colored, or has solid particles in it. It is important that you put your used needles and syringes in a special sharps container. Do not put them in a trash can. If you do not have a sharps container, call your pharmacist or healthcare provider to get one. Talk to your pediatrician regarding the use of this medicine in children. Special care may be needed. Overdosage: If you think you have taken too much of this medicine contact a poison control center or emergency room at once. NOTE: This medicine is only for you. Do not share this medicine with others. What if I miss a dose? It is important not to miss a dose. Your health care professional or doctor should  discuss a plan for missed doses with you. If you do miss a dose, follow their plan. Do not take double doses. What may interact with this medicine? -other medicines for diabetes Many medications may cause an increase or decrease in blood sugar, these include: -alcohol containing beverages -aspirin and aspirin-like drugs -chloramphenicol -chromium -diuretics -male hormones, like estrogens or progestins and birth control pills -heart medicines -isoniazid -male hormones or anabolic steroids -medicines for weight loss -medicines for allergies, asthma, cold, or cough -medicines for mental problems -medicines called MAO Inhibitors like Nardil, Parnate, Marplan, Eldepryl -niacin -NSAIDs, medicines for pain and inflammation, like ibuprofen or naproxen -pentamidine -phenytoin -probenecid -quinolone antibiotics like ciprofloxacin, levofloxacin, ofloxacin -some herbal dietary supplements -steroid medicines like prednisone or cortisone -thyroid medicine Some medications can hide the warning symptoms of low blood sugar. You may need to monitor your blood sugar more closely if you are taking one of these medications. These include: -beta-blockers such as atenolol, metoprolol, propranolol -clonidine -guanethidine -reserpine This list may not describe all possible interactions. Give your health care provider a list of all the medicines, herbs, non-prescription drugs, or dietary supplements you use. Also tell them if you smoke, drink alcohol, or use illegal drugs. Some items may interact with your medicine. What should I watch for while using this medicine? Visit your health care professional or doctor for regular checks on your progress. Do not drive, use machinery, or do anything that needs mental alertness until you know how this medicine affects you. Alcohol may interfere with the effect   of this medicine. Avoid alcoholic drinks. A test called the HbA1C (A1C) will be monitored. This is a  simple blood test. It measures your blood sugar control over the last 2 to 3 months. You will receive this test every 3 to 6 months. Learn how to check your blood sugar. Learn the symptoms of low and high blood sugar and how to manage them. Always carry a quick-source of sugar with you in case you have symptoms of low blood sugar. Examples include hard sugar candy or glucose tablets. Make sure others know that you can choke if you eat or drink when you develop serious symptoms of low blood sugar, such as seizures or unconsciousness. They must get medical help at once. Tell your doctor or health care professional if you have high blood sugar. You might need to change the dose of your medicine. If you are sick or exercising more than usual, you might need to change the dose of your medicine. Do not skip meals. Ask your doctor or health care professional if you should avoid alcohol. Many nonprescription cough and cold products contain sugar or alcohol. These can affect blood sugar. Make sure that you have the right kind of syringe for the type of insulin you use. Try not to change the brand and type of insulin or syringe unless your health care professional or doctor tells you to. Switching insulin brand or type can cause dangerously high or low blood sugar. Always keep an extra supply of insulin, syringes, and needles on hand. Use a syringe one time only. Throw away syringe and needle in a closed container to prevent accidental needle sticks. Insulin pens and cartridges should never be shared. Even if the needle is changed, sharing may result in passing of viruses like hepatitis or HIV. Wear a medical ID bracelet or chain, and carry a card that describes your disease and details of your medicine and dosage times. What side effects may I notice from receiving this medicine? Side effects that you should report to your health care professional or doctor as soon as possible: -allergic reactions like skin rash,  itching or hives, swelling of the face, lips, or tongue -breathing problems -signs and symptoms of high blood sugar such as dizziness, dry mouth, dry skin, fruity breath, nausea, stomach pain, increased hunger or thirst, increased urination -signs and symptoms of low blood sugar such as feeling anxious, confusion, dizziness, increased hunger, unusually weak or tired, sweating, shakiness, cold, irritable, headache, blurred vision, fast heartbeat, loss of consciousness Side effects that usually do not require medical attention (report to your health care professional or doctor if they continue or are bothersome): -increase or decrease in fatty tissue under the skin due to overuse of a particular injection site -itching, burning, swelling, or rash at site where injected This list may not describe all possible side effects. Call your doctor for medical advice about side effects. You may report side effects to FDA at 1-800-FDA-1088. Where should I keep my medicine? Keep out of the reach of children. Store unopened vials in a refrigerator between 2 and 8 degrees C (36 and 46 degrees F). Do not freeze or use if the insulin has been frozen. Opened vials (vials currently in use) may be stored in the refrigerator or at room temperature, at approximately 25 degrees C (77 degrees F) or cooler. Keeping your insulin at room temperature decreases the amount of pain during injection. Once opened, your insulin can be used for 28 days. After 28 days,   the vial should be thrown away. Store Lantus Solostar Pens or Basaglar KwikPens in a refrigerator between 2 and 8 degrees C (36 and 46 degrees F) or at room temperature below 30 degrees C (86 degrees F). Do not freeze or use if the insulin has been frozen. Once opened, the pens should be kept at room temperature. Do not store in the refrigerator once opened. Once opened, the insulin can be used for 28 days. After 28 days, the Lantus Solostar Pen or Basaglar KwikPen should be  thrown away. Store Toujeo Solostar Pens in a refrigerator between 2 and 8 degrees C (36 and 46 degrees F). Do not freeze or use if the insulin has been frozen. Once opened, the pens should be kept at room temperature below 30 degrees C (86 degrees F). Do not store in the refrigerator once opened. Once opened, the insulin can be used for 42 days. After 42 days, the Toujeo Solostar Pen should be thrown away. Protect all insulin vials and pens from light and excessive heat. Throw away any unused medicine after the expiration date or after the specified time for room temperature storage has passed. NOTE: This sheet is a summary. It may not cover all possible information. If you have questions about this medicine, talk to your doctor, pharmacist, or health care provider.    2016, Elsevier/Gold Standard. (2014-12-27 10:12:42)  

## 2015-11-25 ENCOUNTER — Encounter: Payer: Self-pay | Admitting: Family Medicine

## 2015-11-25 ENCOUNTER — Ambulatory Visit (INDEPENDENT_AMBULATORY_CARE_PROVIDER_SITE_OTHER): Payer: Medicare Other | Admitting: Family Medicine

## 2015-11-25 VITALS — BP 120/58 | HR 79 | Temp 98.6°F | Resp 16 | Wt 166.0 lb

## 2015-11-25 DIAGNOSIS — L02429 Furuncle of limb, unspecified: Secondary | ICD-10-CM

## 2015-11-25 MED ORDER — CEPHALEXIN 500 MG PO CAPS: 500.0000 mg | ORAL_CAPSULE | Freq: Four times a day (QID) | ORAL | Status: DC

## 2015-11-25 NOTE — Progress Notes (Signed)
Patient: Jared Paul Male    DOB: 1941/03/29   74 y.o.   MRN: [id number] Visit Date: 11/25/2015  Today's Provider: Vernie Murders, PA   Chief Complaint  Patient presents with  . Follow-up   Subjective:    HPI Follow-up for Furuncle of thigh from 11/21/2015; started: cephALEXin (KEFLEX) 500 MG capsule; Take 1 capsule (500 mg total) by mouth 4 (four) times daily. Went to pick up the prescription at [employer] and did not have it when he got home. Has not found the bottle at home, in the car or turned in at [place]. Has not taken any antibiotic yet.  Past Surgical History  Procedure Laterality Date  . Hemorrhoid surgery    . Tonsillectomy and adenoidectomy    . Laminectomy    . Colonoscopy with propofol N/A 10/07/2015    Procedure: COLONOSCOPY WITH PROPOFOL;  Surgeon: Josefine Class, MD;  Location: [facility] ENDOSCOPY;  Service: Endoscopy;  Laterality: N/A;   Patient Active Problem List   Diagnosis Date Noted  . Apnea, sleep 10/12/2015  . 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 Nocturia 09/12/2015  . Hemorrhoids, internal 09/12/2015  . Spinal stenosis, lumbar 09/12/2015  . Hyperlipidemia 09/12/2015  . Elevated blood pressure 09/12/2015  . Type 2 diabetes mellitus without complication ([place]) [id number]  . Cervical arthritis ([place]) 09/12/2015  . Benign prostate hyperplasia 09/12/2015  . 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 Prednisone     ELEVATED BLOOD SUGAR   Previous Medications   ASPIRIN 81 MG TABLET    Take 81 mg by mouth daily.   CEPHALEXIN (KEFLEX) 500 MG CAPSULE    Take 1 capsule (500 mg total) by mouth 4 (four) times daily.   CHLORPHENIRAMINE (ALLERGY) 4 MG TABLET    Take 1 tablet by mouth daily.   CINNAMON 500 MG CAPSULE    Take 2,000 mg by mouth daily.   DOXAZOSIN (CARDURA) 4 MG TABLET    Take 4 mg by mouth daily.   GAVILYTE-N WITH FLAVOR PACK 420 G  SOLUTION       GLUCOSE BLOOD (ACCU-CHEK AVIVA PLUS) TEST STRIP    Use as instructed and check blood sugar once a day   INSULIN GLARGINE (LANTUS SOLOSTAR) 100 UNIT/ML SOLOSTAR PEN    Inject 6 Units into the skin daily at 10 pm.   MELOXICAM (MOBIC) 15 MG TABLET    Take 15 mg by mouth daily.   METFORMIN (GLUMETZA) 500 MG (MOD) 24 HR TABLET    Take 500 mg by mouth daily with breakfast. TAKE 2 TABLETS EVERY EVENING   OMEGA-3 FATTY ACIDS (FISH OIL) 1000 MG CAPS    Take 4 capsules by mouth.   ONGLYZA 5 MG TABS TABLET    TAKE ONE TABLET BY MOUTH ONCE DAILY   PIOGLITAZONE (ACTOS) 15 MG TABLET    Take 1 tablet (15 mg total) by mouth daily.   SIMVASTATIN (ZOCOR) 20 MG TABLET    Take 1 tablet by mouth  every day    Review of Systems  Constitutional: Negative for fever.  Skin:       Sore on thigh is worse, inflamed, burning, itching    Social History  Substance Use Topics  . Smoking status: Former Research scientist (life sciences)  . Smokeless tobacco: Never Used  . Alcohol Use: No   Objective:   BP 120/58 mmHg  Pulse 79  Temp(Src) 98.6  F (37 C) (Oral)  Resp 16  Wt 166 lb (75.297 kg)  SpO2 97%  Physical Exam  Constitutional: He appears well-developed and well-nourished.  Skin: There is erythema.  Increase in the size of the furuncle on the right anterior thigh. Tender with surrounding pinkness. No drainage from the central scab in a 2 cm area of bright erythema.      Assessment & Plan:     1. Furuncle of thigh Persistent without central fluctuance and with no drainage after puncture of scab with #11 scalpel. Recommend hot [place] soaks and refilled the Cephalexin. Recheck in 1 week. Sooner if any fever develops.       Vernie Murders, PA  [facility]

## 2015-12-05 ENCOUNTER — Ambulatory Visit (INDEPENDENT_AMBULATORY_CARE_PROVIDER_SITE_OTHER): Payer: Medicare Other | Admitting: Family Medicine

## 2015-12-05 ENCOUNTER — Encounter: Payer: Self-pay | Admitting: Family Medicine

## 2015-12-05 VITALS — BP 142/72 | HR 86 | Temp 98.0°F | Resp 16 | Wt 166.6 lb

## 2015-12-05 DIAGNOSIS — E119 Type 2 diabetes mellitus without complications: Secondary | ICD-10-CM

## 2015-12-05 DIAGNOSIS — L02429 Furuncle of limb, unspecified: Secondary | ICD-10-CM

## 2015-12-05 NOTE — Progress Notes (Signed)
Patient ID: Jared Paul, male   DOB: 02/27/1941, 74 y.o.   MRN: [id number]    Subjective:  Diabetes He presents for his follow-up diabetic visit. He has type 2 diabetes mellitus. His disease course has been fluctuating.     Prior to Admission medications   Medication Sig Start Date End Date Taking? Authorizing Provider  aspirin 81 MG tablet Take 81 mg by mouth daily.   Yes Historical Provider, MD  cephALEXin (KEFLEX) 500 MG capsule Take 1 capsule (500 mg total) by mouth 4 (four) times daily. 11/25/15  Yes Dennis E Chrismon, PA  chlorpheniramine (ALLERGY) 4 MG tablet Take 1 tablet by mouth daily.   Yes Historical Provider, MD  Cinnamon 500 MG capsule Take 2,000 mg by mouth daily.   Yes Historical Provider, MD  doxazosin (CARDURA) 4 MG tablet Take 4 mg by mouth daily.   Yes Historical Provider, MD  GAVILYTE-N WITH FLAVOR PACK 420 G solution  10/05/15  Yes Historical Provider, MD  glucose blood (ACCU-CHEK AVIVA PLUS) test strip Use as instructed and check blood sugar once a day 10/07/15  Yes Dennis E Chrismon, PA  Insulin Glargine (LANTUS SOLOSTAR) 100 UNIT/ML Solostar Pen Inject 6 Units into the skin daily at 10 pm. 11/21/15  Yes Dennis E Chrismon, PA  meloxicam (MOBIC) 15 MG tablet Take 15 mg by mouth daily.   Yes Historical Provider, MD  metFORMIN (GLUMETZA) 500 MG (MOD) 24 hr tablet Take 500 mg by mouth daily with breakfast. TAKE 2 TABLETS EVERY EVENING   Yes Historical Provider, MD  Omega-3 Fatty Acids (FISH OIL) 1000 MG CAPS Take 4 capsules by mouth.   Yes Historical Provider, MD  ONGLYZA 5 MG TABS tablet TAKE ONE TABLET BY MOUTH ONCE DAILY 09/02/15  Yes Dennis E Chrismon, PA  pioglitazone (ACTOS) 15 MG tablet Take 1 tablet (15 mg total) by mouth daily. 10/20/15  Yes Vickki Muff Chrismon, PA  simvastatin (ZOCOR) 20 MG tablet Take 1 tablet by mouth  every day 10/07/15  Yes Margo Common, PA   Past Surgical History  Procedure Laterality Date  . Hemorrhoid surgery    . Tonsillectomy and  adenoidectomy    . Laminectomy    . Colonoscopy with propofol N/A 10/07/2015    Procedure: COLONOSCOPY WITH PROPOFOL;  Surgeon: Josefine Class, MD;  Location: [facility] ENDOSCOPY;  Service: Endoscopy;  Laterality: N/A;   Family History  Problem Relation Age of Onset  . Diabetes Mother   . Hypertension Mother   . Stroke Mother   . Lung cancer Father   . Pancreatic cancer Father   . Diabetes Father   . Multiple sclerosis Brother   . Diabetes Brother   . Pneumonia Maternal Grandmother   . Pneumonia Maternal Grandfather   . Heart failure Paternal Grandmother   . Healthy Brother     Patient Active Problem List   Diagnosis Date Noted  . Apnea, sleep 10/12/2015  . 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 Nocturia 09/12/2015  . Hemorrhoids, internal 09/12/2015  . Spinal stenosis, lumbar 09/12/2015  . Hyperlipidemia 09/12/2015  . Elevated blood pressure 09/12/2015  . Type 2 diabetes mellitus without complication ([place]) [id number]  . Cervical arthritis ([place]) 09/12/2015  . Benign prostate hyperplasia 09/12/2015  . 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 Diabetes mellitus without complication ([place])   . Hypertension   . Hyperlipidemia     Social History  Social History  . Marital Status: Married    Spouse Name: N/A  . Number of Children: N/A  . Years of Education: N/A   Occupational History  . Not on file.   Social History Main Topics  . Smoking status: Former Research scientist (life sciences)  . Smokeless tobacco: Never Used  . Alcohol Use: No  . Drug Use: No  . Sexual Activity: Not on file   Other Topics Concern  . Not on file   Social History Narrative    Allergies  Allergen Reactions  . Prednisone     ELEVATED BLOOD SUGAR    Review of Systems  Constitutional: Negative.   HENT: Negative.   Eyes: Negative.   Respiratory: Negative.   Cardiovascular: Negative.   Gastrointestinal: Negative.     Genitourinary: Negative.   Musculoskeletal: Negative.   Skin: Negative.   Neurological: Negative.   Endo/Heme/Allergies: Negative.   Psychiatric/Behavioral: Negative.     Immunization History  Administered Date(s) Administered  . Influenza, High Dose Seasonal PF 10/20/2015  . Pneumococcal Conjugate-13 11/12/2014  . Pneumococcal Polysaccharide-23 11/16/2013  . Td 05/28/2007  . Zoster 11/21/2012   Objective:  BP 142/72 mmHg  Pulse 86  Temp(Src) 98 F (36.7 C) (Oral)  Resp 16  Wt 166 lb 9.6 oz (75.569 kg)  Physical Exam  Constitutional: He is oriented to person, place, and time and well-developed, well-nourished, and in no distress.  HENT:  Head: Normocephalic.  Eyes: Conjunctivae are normal.  Cardiovascular: Normal rate and regular rhythm.   Pulmonary/Chest: Effort normal and breath sounds normal.  Neurological: He is alert and oriented to person, place, and time.  Skin:  Right anterior thigh furuncle fading. No further redness or purulent drainage. No local lymphadenopathy.  Psychiatric: Affect normal.    Lab Results  Component Value Date   WBC 4.9 09/15/2015   HCT 45.2 09/15/2015   GLUCOSE 238* 09/15/2015   CHOL 129 09/15/2015   TRIG 110 09/15/2015   HDL 51 09/15/2015   LDLCALC 56 09/15/2015   HGBA1C 7.7* 09/15/2015    CMP     Component Value Date/Time   NA 139 09/15/2015 0814   K 4.4 09/15/2015 0814   CL 100 09/15/2015 0814   CO2 23 09/15/2015 0814   GLUCOSE 238* 09/15/2015 0814   BUN 16 09/15/2015 0814   CREATININE 0.76 09/15/2015 0814   CALCIUM 9.4 09/15/2015 0814   PROT 6.5 09/15/2015 0814   ALBUMIN 4.5 09/15/2015 0814   AST 16 09/15/2015 0814   ALT 17 09/15/2015 0814   ALKPHOS 38* 09/15/2015 0814   BILITOT 0.9 09/15/2015 0814   GFRNONAA 91 09/15/2015 0814   GFRAA 105 09/15/2015 0814    Assessment and Plan :  1. Furuncle of thigh Finished the antibiotic in the past few days. No further redness, swelling or drainage. Healing well with  improvement. Recheck prn.  2. Type 2 diabetes mellitus without complication, unspecified long term insulin use status ([place]) Has been averaging FBS q 3 days and getting a range of 176-212. Still taking 6 units of Lantus Solostar and oral medications (Onglyza, Metformin and Actos). No hypoglycemic episodes. Will increase to 10 units daily. May increase insulin dosage by 1 unit if 3 day average FBS above 125. If between 100-125, continue the same dose as the previous day. Recheck in 2 weeks.  [facility] 12/05/2015 1:34 PM

## 2015-12-19 ENCOUNTER — Ambulatory Visit (INDEPENDENT_AMBULATORY_CARE_PROVIDER_SITE_OTHER): Payer: Medicare Other | Admitting: Family Medicine

## 2015-12-19 ENCOUNTER — Encounter: Payer: Self-pay | Admitting: Family Medicine

## 2015-12-19 VITALS — BP 140/70 | HR 69 | Temp 97.7°F | Resp 16 | Ht 70.0 in | Wt 166.0 lb

## 2015-12-19 DIAGNOSIS — E119 Type 2 diabetes mellitus without complications: Secondary | ICD-10-CM

## 2015-12-19 MED ORDER — INSULIN GLARGINE 100 UNIT/ML SOLOSTAR PEN: 14.0000 [IU] | PEN_INJECTOR | Freq: Every day | SUBCUTANEOUS | Status: DC

## 2015-12-19 MED ORDER — METFORMIN HCL 500 MG PO TABS: 1000.0000 mg | ORAL_TABLET | Freq: Two times a day (BID) | ORAL | Status: AC

## 2015-12-19 NOTE — Progress Notes (Signed)
Patient ID: Jared Paul, male   DOB: 24-Dec-1941, 74 y.o.   MRN: [id number]        Patient: Jared Paul Male    DOB: 1941/04/12   74 y.o.   MRN: [id number] Visit Date: 12/19/2015  Today's Provider: Vernie Murders, PA   Chief Complaint  Patient presents with  . Diabetes   Subjective:    HPI  Diabetes Mellitus Type II, Follow-up:   Lab Results  Component Value Date   HGBA1C 7.7* 09/15/2015    Last seen for diabetes 2 weeks ago.  Management since then includes increased Lantus to 10 units daily. He reports excellent compliance with treatment. He is not having side effects.  Current symptoms include none and have been improving. Home blood sugar records: fasting range: 108  Episodes of hypoglycemia? no   Current Insulin Regimen: 14 units lantus Most Recent Eye Exam: 10/2015 Dr Covert Weight trend: stable Prior visit with dietician: no Current diet: in general, a "healthy" diet   Current exercise: none  Pertinent Labs:    Component Value Date/Time   CHOL 129 09/15/2015 0814   TRIG 110 09/15/2015 0814   HDL 51 09/15/2015 0814   LDLCALC 56 09/15/2015 0814   CREATININE 0.76 09/15/2015 0814    Wt Readings from Last 3 Encounters:  12/19/15 166 lb (75.297 kg)  12/05/15 166 lb 9.6 oz (75.569 kg)  11/25/15 166 lb (75.297 kg)    ------------------------------------------------------------------------ Patient Active Problem List   Diagnosis Date Noted  . Type 2 diabetes mellitus ([place]) 12/19/2015  . Apnea, sleep 10/12/2015  . 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 Nocturia 09/12/2015  . Hemorrhoids, internal 09/12/2015  . Spinal stenosis, lumbar 09/12/2015  . Hyperlipidemia 09/12/2015  . Elevated blood pressure 09/12/2015  . Type 2 diabetes mellitus without complication ([place]) [id number]  . Cervical arthritis ([place]) 09/12/2015  . Benign prostate hyperplasia 09/12/2015  . 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 Hemorrhoid surgery    . Tonsillectomy and adenoidectomy    . Laminectomy    . Colonoscopy with propofol N/A 10/07/2015    Procedure: COLONOSCOPY WITH PROPOFOL;  Surgeon: Josefine Class, MD;  Location: [facility] ENDOSCOPY;  Service: Endoscopy;  Laterality: N/A;   Family History  Problem Relation Age of Onset  . Diabetes Mother   . Hypertension Mother   . Stroke Mother   . Lung cancer Father   . Pancreatic cancer Father   . Diabetes Father   . Multiple sclerosis Brother   . Diabetes Brother   . Pneumonia Maternal Grandmother   . Pneumonia Maternal Grandfather   . Heart failure Paternal Grandmother   . Healthy Brother     Allergies  Allergen Reactions  . Prednisone     ELEVATED BLOOD SUGAR   Previous Medications   ASPIRIN 81 MG TABLET    Take 81 mg by mouth daily.   CHLORPHENIRAMINE (ALLERGY) 4 MG TABLET    Take 1 tablet by mouth daily.   CINNAMON 500 MG CAPSULE    Take 2,000 mg by mouth daily.   DOXAZOSIN (CARDURA) 4 MG TABLET    Take 4 mg by mouth daily.   GAVILYTE-N WITH FLAVOR PACK 420 G SOLUTION       GLUCOSE BLOOD (ACCU-CHEK AVIVA PLUS) TEST STRIP    Use as instructed and check blood sugar once a day   INSULIN GLARGINE (LANTUS SOLOSTAR) 100 UNIT/ML SOLOSTAR  PEN    Inject 6 Units into the skin daily at 10 pm.   LANCETS (ONETOUCH ULTRASOFT) LANCETS    Daily.   MELOXICAM (MOBIC) 15 MG TABLET    Take 15 mg by mouth daily.   METFORMIN (GLUMETZA) 500 MG (MOD) 24 HR TABLET    Take 500 mg by mouth daily with breakfast. TAKE 2 TABLETS IN THE MORNING AND 2 EVERY EVENING   OMEGA-3 FATTY ACIDS (FISH OIL) 1000 MG CAPS    Take 4 capsules by mouth.   ONGLYZA 5 MG TABS TABLET    TAKE ONE TABLET BY MOUTH ONCE DAILY   PIOGLITAZONE (ACTOS) 15 MG TABLET    Take 1 tablet (15 mg total) by mouth daily.   SIMVASTATIN (ZOCOR) 20 MG TABLET    Take 1 tablet by mouth  every day    Review of Systems  Constitutional: Negative.   HENT:  Negative.   Eyes: Negative.   Respiratory: Negative.   Cardiovascular: Negative.   Endocrine: Negative.   Genitourinary: Negative.   Hematological: Negative.     Social History  Substance Use Topics  . Smoking status: Former Research scientist (life sciences)  . Smokeless tobacco: Never Used  . Alcohol Use: No   Objective:   BP 140/70 mmHg  Pulse 69  Temp(Src) 97.7 F (36.5 C) (Oral)  Resp 16  Ht 5\' 10"  (1.778 m)  Wt 166 lb (75.297 kg)  BMI 23.82 kg/m2  SpO2 97%  Physical Exam  Constitutional: He is oriented to person, place, and time. He appears well-developed and well-nourished.  HENT:  Head: Normocephalic.  Eyes: Conjunctivae are normal.  Cardiovascular: Normal rate, regular rhythm and normal heart sounds.   Pulmonary/Chest: Effort normal and breath sounds normal.  Abdominal: Soft. Bowel sounds are normal.  Lymphadenopathy:    He has no cervical adenopathy.  Neurological: He is alert and oriented to person, place, and time.  Psychiatric: He has a normal mood and affect. His behavior is normal.      Assessment & Plan:     1. Type 2 diabetes mellitus without complication, unspecified long term insulin use status (HCC) Feeling well without episodes of hypoglycemia. No polyuria, polyphagia or polydipsia. Has adjusted Lantus Solostar to 14 units daily by averaging FBS readings every 3 days and increasing by 1 unit for averages over 130. Will refill Metformin and Lantus Solostar at his local pharmacy. Recheck Hgb A1C was 8.2% today. Recheck in 3 months. - metFORMIN (GLUCOPHAGE) 500 MG tablet; Take 2 tablets (1,000 mg total) by mouth 2 (two) times daily with a meal.  Dispense: 60 tablet; Refill: 6 - Insulin Glargine (LANTUS SOLOSTAR) 100 UNIT/ML Solostar Pen; Inject 14 Units into the skin daily at 10 pm.  Dispense: 5 pen; Refill: [place], PA  [facility]

## 2015-12-21 ENCOUNTER — Telehealth: Payer: Self-pay | Admitting: Family Medicine

## 2015-12-21 DIAGNOSIS — E119 Type 2 diabetes mellitus without complications: Secondary | ICD-10-CM

## 2015-12-21 NOTE — Telephone Encounter (Signed)
Pt said when he went to pick up his prescriptions that were to be called in from his visit earlier this week the [place] was missing.  He has  Been taking the samples that you gave him but they are about to run out.    He uses [address].   Pts callback is [phone]  Thanks Con Memos

## 2015-12-23 ENCOUNTER — Other Ambulatory Visit: Payer: Self-pay | Admitting: Family Medicine

## 2015-12-23 DIAGNOSIS — E119 Type 2 diabetes mellitus without complications: Secondary | ICD-10-CM

## 2015-12-23 MED ORDER — INSULIN GLARGINE 100 UNIT/ML SOLOSTAR PEN: 14.0000 [IU] | PEN_INJECTOR | Freq: Every day | SUBCUTANEOUS | Status: AC

## 2015-12-23 NOTE — Telephone Encounter (Signed)
Left message on pt's vm. Okay per dpr.  

## 2015-12-23 NOTE — Telephone Encounter (Signed)
Pt came by the office b/c [facility] didn't get the RX for Insulin Glargine (LANTUS SOLOSTAR) 100 UNIT/ML Solostar Pen. Pt stated that he is out and needs this today and they are going out of town tomorrow. Pt would like this sent to [place] today if possible. This is a Jared Paul pt and pt is aware that Jared Paul is out of the office until 01/03/16. Pt request a call back to update him on the RX. I didn't see any samples in the back. Thanks TNP

## 2015-12-23 NOTE — Telephone Encounter (Signed)
rx has been sent to [facility]

## 2015-12-23 NOTE — Telephone Encounter (Signed)
Please advise refill? No samples available.

## 2016-02-08 ENCOUNTER — Ambulatory Visit (INDEPENDENT_AMBULATORY_CARE_PROVIDER_SITE_OTHER): Payer: Medicare Other | Admitting: Physician Assistant

## 2016-02-08 ENCOUNTER — Encounter: Payer: Self-pay | Admitting: Physician Assistant

## 2016-02-08 VITALS — BP 138/62 | HR 76 | Temp 98.2°F | Resp 16 | Ht 70.0 in | Wt 171.0 lb

## 2016-02-08 DIAGNOSIS — K5730 Diverticulosis of large intestine without perforation or abscess without bleeding: Secondary | ICD-10-CM | POA: Diagnosis not present

## 2016-02-08 MED ORDER — CIPROFLOXACIN HCL 500 MG PO TABS: 500.0000 mg | ORAL_TABLET | Freq: Two times a day (BID) | ORAL | Status: DC

## 2016-02-08 MED ORDER — METRONIDAZOLE 500 MG PO TABS: 500.0000 mg | ORAL_TABLET | Freq: Two times a day (BID) | ORAL | Status: DC

## 2016-02-08 NOTE — Patient Instructions (Signed)
Diverticulosis Diverticulosis is the condition that develops when small pouches (diverticula) form in the wall of your colon. Your colon, or large intestine, is where water is absorbed and stool is formed. The pouches form when the inside layer of your colon pushes through weak spots in the outer layers of your colon. CAUSES  No one knows exactly what causes diverticulosis. RISK FACTORS  Being older than 50. Your risk for this condition increases with age. Diverticulosis is rare in people younger than 40 years. By age 80, almost everyone has it.  Eating a low-fiber diet.  Being frequently constipated.  Being overweight.  Not getting enough exercise.  Smoking.  Taking over-the-counter pain medicines, like aspirin and ibuprofen. SYMPTOMS  Most people with diverticulosis do not have symptoms. DIAGNOSIS  Because diverticulosis often has no symptoms, health care providers often discover the condition during an exam for other colon problems. In many cases, a health care provider will diagnose diverticulosis while using a flexible scope to examine the colon (colonoscopy). TREATMENT  If you have never developed an infection related to diverticulosis, you may not need treatment. If you have had an infection before, treatment may include:  Eating more fruits, vegetables, and grains.  Taking a fiber supplement.  Taking a live bacteria supplement (probiotic).  Taking medicine to relax your colon. HOME CARE INSTRUCTIONS   Drink at least 6-8 glasses of water each day to prevent constipation.  Try not to strain when you have a bowel movement.  Keep all follow-up appointments. If you have had an infection before:  Increase the fiber in your diet as directed by your health care provider or dietitian.  Take a dietary fiber supplement if your health care provider approves.  Only take medicines as directed by your health care provider. SEEK MEDICAL CARE IF:   You have abdominal  pain.  You have bloating.  You have cramps.  You have not gone to the bathroom in 3 days. SEEK IMMEDIATE MEDICAL CARE IF:   Your pain gets worse.  Yourbloating becomes very bad.  You have a fever or chills, and your symptoms suddenly get worse.  You begin vomiting.  You have bowel movements that are bloody or black. MAKE SURE YOU:  Understand these instructions.  Will watch your condition.  Will get help right away if you are not doing well or get worse.   This information is not intended to replace advice given to you by your health care provider. Make sure you discuss any questions you have with your health care provider.   Document Released: 09/13/2004 Document Revised: 12/22/2013 Document Reviewed: 11/11/2013 Elsevier Interactive Patient Education 2016 Elsevier Inc[employer]  

## 2016-02-08 NOTE — Progress Notes (Signed)
Patient ID: Jared Paul, male   DOB: 1941/02/27, 75 y.o.   MRN: [id number]       Patient: Jared Paul Male    DOB: 1941-09-12   75 y.o.   MRN: [id number] Visit Date: 02/08/2016  Today's Provider: Mar Daring, PA-C   Chief Complaint  Patient presents with  . Abdominal Pain    X 2 weeks at least.    Subjective:    Abdominal Pain This is a new problem. The current episode started 1 to 4 weeks ago. The onset quality is sudden. The problem occurs constantly. The problem has been gradually worsening. The pain is located in the LLQ. The pain is at a severity of 3/10. The pain is mild. The quality of the pain is dull. The abdominal pain radiates to the back. Associated symptoms include nausea. Pertinent negatives include no constipation, diarrhea, dysuria, fever, flatus, frequency, headaches, hematochezia, hematuria, melena or vomiting. Nothing aggravates the pain. The pain is relieved by nothing. He has tried acetaminophen for the symptoms. The treatment provided no relief.  Patient reports that he had a colonoscopy in 11/2015 and it revealed he had diverticulosis. Patient thinks that his symptoms today could be diverticulosis.      Allergies  Allergen Reactions  . Prednisone     ELEVATED BLOOD SUGAR   Previous Medications   ASPIRIN 81 MG TABLET    Take 81 mg by mouth daily.   CHLORPHENIRAMINE (ALLERGY) 4 MG TABLET    Take 1 tablet by mouth daily.   CINNAMON 500 MG CAPSULE    Take 2,000 mg by mouth daily.   DOXAZOSIN (CARDURA) 4 MG TABLET    Take 4 mg by mouth daily.   GAVILYTE-N WITH FLAVOR PACK 420 G SOLUTION       GLUCOSE BLOOD (ACCU-CHEK AVIVA PLUS) TEST STRIP    Use as instructed and check blood sugar once a day   INSULIN GLARGINE (LANTUS SOLOSTAR) 100 UNIT/ML SOLOSTAR PEN    Inject 14 Units into the skin daily at 10 pm.   LANCETS (ONETOUCH ULTRASOFT) LANCETS    Daily.   MELOXICAM (MOBIC) 15 MG TABLET    Take 15 mg by mouth daily.   METFORMIN (GLUCOPHAGE) 500 MG TABLET     Take 2 tablets (1,000 mg total) by mouth 2 (two) times daily with a meal.   OMEGA-3 FATTY ACIDS (FISH OIL) 1000 MG CAPS    Take 4 capsules by mouth.   ONGLYZA 5 MG TABS TABLET    TAKE ONE TABLET BY MOUTH ONCE DAILY   PIOGLITAZONE (ACTOS) 15 MG TABLET    Take 1 tablet (15 mg total) by mouth daily.   SIMVASTATIN (ZOCOR) 20 MG TABLET    Take 1 tablet by mouth  every day    Review of Systems  Constitutional: Negative.  Negative for fever.  Respiratory: Negative for cough, choking, chest tightness, shortness of breath and wheezing.   Cardiovascular: Negative for chest pain, palpitations and leg swelling.  Gastrointestinal: Positive for nausea and abdominal pain. Negative for vomiting, diarrhea, constipation, melena, hematochezia and flatus.  Genitourinary: Negative for dysuria, urgency, frequency and hematuria.  Neurological: Negative for dizziness and headaches.    Social History  Substance Use Topics  . Smoking status: Former Research scientist (life sciences)  . Smokeless tobacco: Never Used  . Alcohol Use: No   Objective:   There were no vitals taken for this visit.  Physical Exam  Constitutional: He appears well-developed and well-nourished. No distress.  HENT:  Head:  Normocephalic and atraumatic.  Neck: Normal range of motion. Neck supple. No tracheal deviation present. No thyromegaly present.  Cardiovascular: Normal rate, regular rhythm and normal heart sounds.  Exam reveals no gallop and no friction rub.   No murmur heard. Pulmonary/Chest: Effort normal and breath sounds normal. No respiratory distress. He has no wheezes. He has no rales.  Abdominal: Soft. Bowel sounds are normal. He exhibits no distension, no abdominal bruit, no ascites, no pulsatile midline mass and no mass. There is no hepatosplenomegaly. There is tenderness (mild) in the left lower quadrant. There is no rebound, no guarding and no CVA tenderness.  Lymphadenopathy:    He has no cervical adenopathy.  Skin: He is not diaphoretic.    Vitals reviewed.       Assessment & Plan:     1. Diverticulosis of large intestine without hemorrhage Left lower quadrant abdominal pain most likely consistent with diverticulosis. Will treat as below. Advised to avoid nuts, popcorn and fruits with little seeds such as kiwi or strawberries. Advised that if he develops severe abdominal pain, nausea, vomiting and fever he is to go to the hospital. He voiced understanding. He is to call the office if symptoms felt to improve or worsen with below treatment.  Of note he also mentioned having some cramping and pressure over his chest, left thigh and mid to low back on 3 different occasions over the last 4 weeks. He also has had nighttime cramps as well. I questioned if these may be secondary to his simvastatin area and I did advise him that he may take CoQ10 and/or magnesium to see if this helps to lessen these episodes. He is to call the office if they continue in follow-up with myself for [facility].  - ciprofloxacin (CIPRO) 500 MG tablet; Take 1 tablet (500 mg total) by mouth 2 (two) times daily.  Dispense: 14 tablet; Refill: 0 - metroNIDAZOLE (FLAGYL) 500 MG tablet; Take 1 tablet (500 mg total) by mouth 2 (two) times daily.  Dispense: 14 tablet; Refill: 0       Mar Daring, PA-C  [facility]

## 2016-02-14 ENCOUNTER — Ambulatory Visit (INDEPENDENT_AMBULATORY_CARE_PROVIDER_SITE_OTHER): Payer: Medicare Other | Admitting: Family Medicine

## 2016-02-14 ENCOUNTER — Other Ambulatory Visit: Payer: Self-pay | Admitting: Family Medicine

## 2016-02-14 ENCOUNTER — Encounter: Payer: Self-pay | Admitting: Family Medicine

## 2016-02-14 VITALS — BP 128/84 | HR 80 | Temp 97.6°F | Resp 16 | Wt 170.0 lb

## 2016-02-14 DIAGNOSIS — R1032 Left lower quadrant pain: Secondary | ICD-10-CM | POA: Diagnosis not present

## 2016-02-14 DIAGNOSIS — K5730 Diverticulosis of large intestine without perforation or abscess without bleeding: Secondary | ICD-10-CM | POA: Diagnosis not present

## 2016-02-14 DIAGNOSIS — E785 Hyperlipidemia, unspecified: Secondary | ICD-10-CM | POA: Insufficient documentation

## 2016-02-14 LAB — POCT URINALYSIS DIPSTICK
BILIRUBIN UA: NEGATIVE
GLUCOSE UA: NEGATIVE
KETONES UA: NEGATIVE
Leukocytes, UA: NEGATIVE
Nitrite, UA: NEGATIVE
UROBILINOGEN UA: 0.2
pH, UA: 6

## 2016-02-14 MED ORDER — CIPROFLOXACIN HCL 500 MG PO TABS: 500.0000 mg | ORAL_TABLET | Freq: Two times a day (BID) | ORAL | Status: DC

## 2016-02-14 NOTE — Progress Notes (Signed)
Patient ID: Jared Paul, male   DOB: Apr 21, 1941, 75 y.o.   MRN: [id number]   Patient: Jared Paul Male    DOB: 07/18/1941   75 y.o.   MRN: [id number] Visit Date: 02/14/2016  Today's Provider: Vernie Murders, PA   Chief Complaint  Patient presents with  . Abdominal Pain   Subjective:    Abdominal Pain This is a recurrent problem. Episode onset: over the past 3 weeks. The onset quality is sudden. The problem occurs daily (waxing and waning pattern). The problem has been gradually worsening. The pain is located in the LLQ. The quality of the pain is aching and dull (with pressure pain). Associated symptoms include belching and nausea. Pertinent negatives include no hematochezia or hematuria. Nothing aggravates the pain. The pain is relieved by recumbency. He has tried antibiotics for the symptoms. The treatment provided no relief.   Past Medical History  Diagnosis Date  . Diabetes mellitus without complication ([place])   . Hypertension   . Hyperlipidemia    Patient Active Problem List   Diagnosis Date Noted  . HLD (hyperlipidemia) 02/14/2016  . Type 2 diabetes mellitus ([place]) 12/19/2015  . Apnea, sleep 10/12/2015  . 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 Nocturia 09/12/2015  . Hemorrhoids, internal 09/12/2015  . Spinal stenosis, lumbar 09/12/2015  . Hyperlipidemia 09/12/2015  . Elevated blood pressure 09/12/2015  . Type 2 diabetes mellitus without complication ([place]) [id number]  . Cervical arthritis ([place]) 09/12/2015  . Benign prostate hyperplasia 09/12/2015  . 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 ED (erectile dysfunction) of organic origin 03/31/2007   Past Surgical History  Procedure Laterality Date  . Hemorrhoid surgery    . Tonsillectomy and adenoidectomy    . Laminectomy    . Colonoscopy with propofol N/A 10/07/2015    Procedure: COLONOSCOPY WITH PROPOFOL;  Surgeon: Josefine Class, MD;  Location: [facility] ENDOSCOPY;  Service: Endoscopy;  Laterality: N/A;   Family History    Problem Relation Age of Onset  . Diabetes Mother   . Hypertension Mother   . Stroke Mother   . Lung cancer Father   . Pancreatic cancer Father   . Diabetes Father   . Multiple sclerosis Brother   . Diabetes Brother   . Pneumonia Maternal Grandmother   . Pneumonia Maternal Grandfather   . Heart failure Paternal Grandmother   . Healthy Brother      Previous Medications   ASPIRIN 81 MG TABLET    Take 81 mg by mouth daily.   CHLORPHENIRAMINE (ALLERGY) 4 MG TABLET    Take 1 tablet by mouth daily.   CINNAMON 500 MG CAPSULE    Take 2,000 mg by mouth daily.   CIPROFLOXACIN (CIPRO) 500 MG TABLET    Take 1 tablet (500 mg total) by mouth 2 (two) times daily.   DOXAZOSIN (CARDURA) 4 MG TABLET    Take 4 mg by mouth daily.   GAVILYTE-N WITH FLAVOR PACK 420 G SOLUTION       GLUCOSE BLOOD (ACCU-CHEK AVIVA PLUS) TEST STRIP    Use as instructed and check blood sugar once a day   INSULIN GLARGINE (LANTUS SOLOSTAR) 100 UNIT/ML SOLOSTAR PEN    Inject 14 Units into the skin daily at 10 pm.   LANCETS (ONETOUCH ULTRASOFT) LANCETS    Daily.   MELOXICAM (MOBIC) 15 MG TABLET    Take 15 mg by mouth daily.   METFORMIN (GLUCOPHAGE) 500 MG TABLET    Take 2 tablets (1,000 mg total) by mouth 2 (  two) times daily with a meal.   METRONIDAZOLE (FLAGYL) 500 MG TABLET    Take 1 tablet (500 mg total) by mouth 2 (two) times daily.   OMEGA-3 FATTY ACIDS (FISH OIL) 1000 MG CAPS    Take 4 capsules by mouth.   ONGLYZA 5 MG TABS TABLET    TAKE ONE TABLET BY MOUTH ONCE DAILY   PIOGLITAZONE (ACTOS) 15 MG TABLET    Take 1 tablet (15 mg total) by mouth daily.   SIMVASTATIN (ZOCOR) 20 MG TABLET    Take 1 tablet by mouth  every day   Allergies  Allergen Reactions  . Prednisone     ELEVATED BLOOD SUGAR    Review of Systems  Constitutional: Negative.   HENT: Negative.   Eyes: Negative.   Respiratory: Negative.   Cardiovascular: Negative.   Gastrointestinal: Positive for nausea and abdominal pain. Negative for hematochezia.   Endocrine: Negative.   Genitourinary: Negative.  Negative for hematuria.  Musculoskeletal: Negative.   Skin: Negative.   Allergic/Immunologic: Negative.   Neurological: Negative.   Hematological: Negative.   Psychiatric/Behavioral: Negative.     Social History  Substance Use Topics  . Smoking status: Former Research scientist (life sciences)  . Smokeless tobacco: Never Used  . Alcohol Use: No   Objective:   BP 128/84 mmHg  Pulse 80  Temp(Src) 97.6 F (36.4 C) (Oral)  Resp 16  Wt 170 lb (77.111 kg)  SpO2 98%  Physical Exam  Constitutional: He is oriented to person, place, and time. He appears well-developed and well-nourished. No distress.  HENT:  Head: Normocephalic and atraumatic.  Right Ear: Hearing normal.  Left Ear: Hearing normal.  Nose: Nose normal.  Eyes: Conjunctivae and lids are normal. Right eye exhibits no discharge. Left eye exhibits no discharge. No scleral icterus.  Cardiovascular: Regular rhythm.   Pulmonary/Chest: Effort normal and breath sounds normal. No respiratory distress.  Abdominal: Soft. He exhibits no mass. There is tenderness. There is rebound. There is no guarding.  Some pain referral to LLQ with jarring or deep palpation. BS essentially normal. No masses felt.   Musculoskeletal: Normal range of motion.  Neurological: He is alert and oriented to person, place, and time.  Skin: Skin is intact. No lesion and no rash noted.  Psychiatric: He has a normal mood and affect. His speech is normal and behavior is normal. Thought content normal.      Assessment & Plan:     1. LLQ abdominal pain Continues to have persistent LLQ pain in a waxing and waning pattern over the past 3 weeks. Will finish the Flagyl and Cipro tomorrow. Last colonoscopy was 10-07-15 by Dr. Rayann Heman (gastroenterologist) that showed sigmoid diverticulosis. Will check labs and schedule CT scan. Recommend bland diet and increased fluid intake. May need referral to Dr. Rayann Heman or general surgeon pending reports. - CBC  with Differential/Platelet - COMPLETE METABOLIC PANEL WITH GFR - CT Abdomen Pelvis Wo Contrast - POCT urinalysis dipstick  2. Diverticulosis of sigmoid colon Continue Cipro after finishing Flagyl because patient is having some nausea. Check CT scan of abdomen and pelvis to rule out acute diverticulitis versus renal stone. - ciprofloxacin (CIPRO) 500 MG tablet; Take 1 tablet (500 mg total) by mouth 2 (two) times daily.  Dispense: 14 tablet; Refill: 0

## 2016-02-14 NOTE — Patient Instructions (Signed)
Diverticulitis °Diverticulitis is inflammation or infection of small pouches in your colon that form when you have a condition called diverticulosis. The pouches in your colon are called diverticula. Your colon, or large intestine, is where water is absorbed and stool is formed. °Complications of diverticulitis can include: °· Bleeding. °· Severe infection. °· Severe pain. °· Perforation of your colon. °· Obstruction of your colon. °CAUSES  °Diverticulitis is caused by bacteria. °Diverticulitis happens when stool becomes trapped in diverticula. This allows bacteria to grow in the diverticula, which can lead to inflammation and infection. °RISK FACTORS °People with diverticulosis are at risk for diverticulitis. Eating a diet that does not include enough fiber from fruits and vegetables may make diverticulitis more likely to develop. °SYMPTOMS  °Symptoms of diverticulitis may include: °· Abdominal pain and tenderness. The pain is normally located on the left side of the abdomen, but may occur in other areas. °· Fever and chills. °· Bloating. °· Cramping. °· Nausea. °· Vomiting. °· Constipation. °· Diarrhea. °· Blood in your stool. °DIAGNOSIS  °Your health care provider will ask you about your medical history and do a physical exam. You may need to have tests done because many medical conditions can cause the same symptoms as diverticulitis. Tests may include: °· Blood tests. °· Urine tests. °· Imaging tests of the abdomen, including X-rays and CT scans. °When your condition is under control, your health care provider may recommend that you have a colonoscopy. A colonoscopy can show how severe your diverticula are and whether something else is causing your symptoms. °TREATMENT  °Most cases of diverticulitis are mild and can be treated at home. Treatment may include: °· Taking over-the-counter pain medicines. °· Following a clear liquid diet. °· Taking antibiotic medicines by mouth for 7-10 days. °More severe cases may  be treated at a hospital. Treatment may include: °· Not eating or drinking. °· Taking prescription pain medicine. °· Receiving antibiotic medicines through an IV tube. °· Receiving fluids and nutrition through an IV tube. °· Surgery. °HOME CARE INSTRUCTIONS  °· Follow your health care provider's instructions carefully. °· Follow a full liquid diet or other diet as directed by your health care provider. After your symptoms improve, your health care provider may tell you to change your diet. He or she may recommend you eat a high-fiber diet. Fruits and vegetables are good sources of fiber. Fiber makes it easier to pass stool. °· Take fiber supplements or probiotics as directed by your health care provider. °· Only take medicines as directed by your health care provider. °· Keep all your follow-up appointments. °SEEK MEDICAL CARE IF:  °· Your pain does not improve. °· You have a hard time eating food. °· Your bowel movements do not return to normal. °SEEK IMMEDIATE MEDICAL CARE IF:  °· Your pain becomes worse. °· Your symptoms do not get better. °· Your symptoms suddenly get worse. °· You have a fever. °· You have repeated vomiting. °· You have bloody or black, tarry stools. °MAKE SURE YOU:  °· Understand these instructions. °· Will watch your condition. °· Will get help right away if you are not doing well or get worse. °  °This information is not intended to replace advice given to you by your health care provider. Make sure you discuss any questions you have with your health care provider. °  °Document Released: 09/26/2005 Document Revised: 12/22/2013 Document Reviewed: 11/11/2013 °Elsevier Interactive Patient Education ©2016 [employer] ° °

## 2016-02-15 LAB — CBC WITH DIFFERENTIAL/PLATELET
Basophils Absolute: 0 10*3/uL (ref 0.0–0.2)
Basos: 0 %
EOS (ABSOLUTE): 0.1 10*3/uL (ref 0.0–0.4)
EOS: 2 %
HEMATOCRIT: 42.6 % (ref 37.5–51.0)
Hemoglobin: 14.7 g/dL (ref 12.6–17.7)
Immature Grans (Abs): 0 10*3/uL (ref 0.0–0.1)
Immature Granulocytes: 0 %
LYMPHS ABS: 0.9 10*3/uL (ref 0.7–3.1)
Lymphs: 19 %
MCH: 30.4 pg (ref 26.6–33.0)
MCHC: 34.5 g/dL (ref 31.5–35.7)
MCV: 88 fL (ref 79–97)
MONOS ABS: 0.4 10*3/uL (ref 0.1–0.9)
Monocytes: 9 %
Neutrophils Absolute: 3.5 10*3/uL (ref 1.4–7.0)
Neutrophils: 70 %
Platelets: 119 10*3/uL — ABNORMAL LOW (ref 150–379)
RBC: 4.84 x10E6/uL (ref 4.14–5.80)
RDW: 14.1 % (ref 12.3–15.4)
WBC: 5 10*3/uL (ref 3.4–10.8)

## 2016-02-15 LAB — COMPREHENSIVE METABOLIC PANEL
A/G RATIO: 2.1 (ref 1.1–2.5)
ALK PHOS: 76 IU/L (ref 39–117)
ALT: 43 IU/L (ref 0–44)
AST: 37 IU/L (ref 0–40)
Albumin: 4.6 g/dL (ref 3.5–4.8)
BUN / CREAT RATIO: 25 — AB (ref 10–22)
BUN: 24 mg/dL (ref 8–27)
Bilirubin Total: 0.7 mg/dL (ref 0.0–1.2)
CHLORIDE: 101 mmol/L (ref 96–106)
CO2: 24 mmol/L (ref 18–29)
CREATININE: 0.96 mg/dL (ref 0.76–1.27)
Calcium: 10.6 mg/dL — ABNORMAL HIGH (ref 8.6–10.2)
GFR, EST AFRICAN AMERICAN: 90 mL/min/{1.73_m2} (ref >59)
GFR, EST NON AFRICAN AMERICAN: 78 mL/min/{1.73_m2} (ref >59)
GLOBULIN, TOTAL: 2.2 g/dL (ref 1.5–4.5)
Glucose: 142 mg/dL — ABNORMAL HIGH (ref 65–99)
Potassium: 4.5 mmol/L (ref 3.5–5.2)
Sodium: 146 mmol/L — ABNORMAL HIGH (ref 134–144)
Total Protein: 6.8 g/dL (ref 6.0–8.5)

## 2016-02-16 ENCOUNTER — Telehealth: Payer: Self-pay

## 2016-02-16 NOTE — Telephone Encounter (Signed)
-----   Message from Margo Common, [place] sent at 02/16/2016  8:13 AM EST ----- No elevation of WBC count or anemia. Platelets a little low. Blood sugar better than 5 months ago but a little elevated at 142. Continue present medication and watch diet closely. Urinalysis essentially normal. Calcium slightly elevated. Awaiting final report of CT scan. May need to schedule referral to gastroenterologist.

## 2016-02-16 NOTE — Telephone Encounter (Signed)
Patient advised as directed below. Patient verbalized understanding and agrees with plan of care.  

## 2016-02-17 ENCOUNTER — Other Ambulatory Visit: Payer: Self-pay | Admitting: Family Medicine

## 2016-02-17 DIAGNOSIS — R1032 Left lower quadrant pain: Secondary | ICD-10-CM

## 2016-02-20 ENCOUNTER — Ambulatory Visit
Admission: RE | Admit: 2016-02-20 | Discharge: 2016-02-20 | Disposition: A | Discharge status: Home / Self Care | Payer: Medicare Other | Source: Ambulatory Visit | Attending: Family Medicine | Admitting: Family Medicine

## 2016-02-20 DIAGNOSIS — N400 Enlarged prostate without lower urinary tract symptoms: Secondary | ICD-10-CM | POA: Diagnosis not present

## 2016-02-20 DIAGNOSIS — K5730 Diverticulosis of large intestine without perforation or abscess without bleeding: Secondary | ICD-10-CM | POA: Insufficient documentation

## 2016-02-20 DIAGNOSIS — I313 Pericardial effusion (noninflammatory): Secondary | ICD-10-CM | POA: Insufficient documentation

## 2016-02-20 DIAGNOSIS — C787 Secondary malignant neoplasm of liver and intrahepatic bile duct: Secondary | ICD-10-CM | POA: Insufficient documentation

## 2016-02-20 DIAGNOSIS — I871 Compression of vein: Secondary | ICD-10-CM | POA: Insufficient documentation

## 2016-02-20 DIAGNOSIS — R1032 Left lower quadrant pain: Secondary | ICD-10-CM | POA: Diagnosis present

## 2016-02-20 MED ORDER — IOHEXOL 300 MG/ML  SOLN
100.0000 mL | Freq: Once | INTRAMUSCULAR | Status: AC | PRN
  Administered 2016-02-20: 100 mL via INTRAVENOUS

## 2016-02-21 ENCOUNTER — Telehealth: Payer: Self-pay | Admitting: Family Medicine

## 2016-02-21 ENCOUNTER — Other Ambulatory Visit: Payer: Self-pay | Admitting: Family Medicine

## 2016-02-21 DIAGNOSIS — C251 Malignant neoplasm of body of pancreas: Secondary | ICD-10-CM

## 2016-02-21 NOTE — Telephone Encounter (Signed)
Jared Paul advised patient of CT scan results.

## 2016-02-21 NOTE — Telephone Encounter (Signed)
Pt called for results from CT scan,  Call back [phone]  [place]

## 2016-02-22 ENCOUNTER — Ambulatory Visit: Payer: Medicare Other

## 2016-02-22 ENCOUNTER — Encounter: Payer: Self-pay | Admitting: Internal Medicine

## 2016-02-22 ENCOUNTER — Other Ambulatory Visit: Payer: Self-pay | Admitting: Family Medicine

## 2016-02-22 ENCOUNTER — Inpatient Hospital Stay: Payer: Medicare Other | Attending: Internal Medicine | Admitting: Internal Medicine

## 2016-02-22 VITALS — BP 147/86 | HR 78 | Temp 97.2°F | Resp 18 | Ht 70.0 in | Wt 169.5 lb

## 2016-02-22 DIAGNOSIS — Z79899 Other long term (current) drug therapy: Secondary | ICD-10-CM | POA: Insufficient documentation

## 2016-02-22 DIAGNOSIS — Z794 Long term (current) use of insulin: Secondary | ICD-10-CM | POA: Insufficient documentation

## 2016-02-22 DIAGNOSIS — E785 Hyperlipidemia, unspecified: Secondary | ICD-10-CM | POA: Insufficient documentation

## 2016-02-22 DIAGNOSIS — I10 Essential (primary) hypertension: Secondary | ICD-10-CM | POA: Diagnosis not present

## 2016-02-22 DIAGNOSIS — R634 Abnormal weight loss: Secondary | ICD-10-CM | POA: Diagnosis not present

## 2016-02-22 DIAGNOSIS — R079 Chest pain, unspecified: Secondary | ICD-10-CM | POA: Insufficient documentation

## 2016-02-22 DIAGNOSIS — Z87891 Personal history of nicotine dependence: Secondary | ICD-10-CM | POA: Insufficient documentation

## 2016-02-22 DIAGNOSIS — Z7982 Long term (current) use of aspirin: Secondary | ICD-10-CM | POA: Diagnosis not present

## 2016-02-22 DIAGNOSIS — I313 Pericardial effusion (noninflammatory): Secondary | ICD-10-CM | POA: Insufficient documentation

## 2016-02-22 DIAGNOSIS — K869 Disease of pancreas, unspecified: Secondary | ICD-10-CM | POA: Diagnosis not present

## 2016-02-22 DIAGNOSIS — N400 Enlarged prostate without lower urinary tract symptoms: Secondary | ICD-10-CM | POA: Diagnosis not present

## 2016-02-22 DIAGNOSIS — R110 Nausea: Secondary | ICD-10-CM | POA: Insufficient documentation

## 2016-02-22 DIAGNOSIS — R1032 Left lower quadrant pain: Secondary | ICD-10-CM | POA: Insufficient documentation

## 2016-02-22 DIAGNOSIS — K5792 Diverticulitis of intestine, part unspecified, without perforation or abscess without bleeding: Secondary | ICD-10-CM | POA: Insufficient documentation

## 2016-02-22 DIAGNOSIS — E119 Type 2 diabetes mellitus without complications: Secondary | ICD-10-CM | POA: Diagnosis not present

## 2016-02-22 DIAGNOSIS — D696 Thrombocytopenia, unspecified: Secondary | ICD-10-CM | POA: Insufficient documentation

## 2016-02-22 DIAGNOSIS — Z800 Family history of malignant neoplasm of digestive organs: Secondary | ICD-10-CM | POA: Diagnosis not present

## 2016-02-22 DIAGNOSIS — C250 Malignant neoplasm of head of pancreas: Secondary | ICD-10-CM

## 2016-02-22 DIAGNOSIS — Z801 Family history of malignant neoplasm of trachea, bronchus and lung: Secondary | ICD-10-CM | POA: Diagnosis not present

## 2016-02-22 DIAGNOSIS — Z01818 Encounter for other preprocedural examination: Secondary | ICD-10-CM

## 2016-02-22 DIAGNOSIS — R160 Hepatomegaly, not elsewhere classified: Secondary | ICD-10-CM | POA: Diagnosis not present

## 2016-02-22 DIAGNOSIS — K429 Umbilical hernia without obstruction or gangrene: Secondary | ICD-10-CM | POA: Diagnosis not present

## 2016-02-22 DIAGNOSIS — K7689 Other specified diseases of liver: Secondary | ICD-10-CM

## 2016-02-22 DIAGNOSIS — K769 Liver disease, unspecified: Secondary | ICD-10-CM

## 2016-02-22 LAB — COMPREHENSIVE METABOLIC PANEL
ALK PHOS: 79 U/L (ref 38–126)
ALT: 64 U/L — AB (ref 17–63)
AST: 50 U/L — AB (ref 15–41)
Albumin: 4 g/dL (ref 3.5–5.0)
Anion gap: 6 (ref 5–15)
BUN: 26 mg/dL — AB (ref 6–20)
CALCIUM: 9.1 mg/dL (ref 8.9–10.3)
CHLORIDE: 103 mmol/L (ref 101–111)
CO2: 27 mmol/L (ref 22–32)
CREATININE: 0.74 mg/dL (ref 0.61–1.24)
GFR calc Af Amer: >60 mL/min (ref >60)
Glucose, Bld: 192 mg/dL — ABNORMAL HIGH (ref 65–99)
Potassium: 3.8 mmol/L (ref 3.5–5.1)
Sodium: 136 mmol/L (ref 135–145)
Total Bilirubin: 0.8 mg/dL (ref 0.3–1.2)
Total Protein: 6.8 g/dL (ref 6.5–8.1)

## 2016-02-22 LAB — CBC WITH DIFFERENTIAL/PLATELET
BASOS ABS: 0 10*3/uL (ref 0–0.1)
Basophils Relative: 1 %
EOS PCT: 2 %
Eosinophils Absolute: 0.1 10*3/uL (ref 0–0.7)
HCT: 42.1 % (ref 40.0–52.0)
HEMOGLOBIN: 14.3 g/dL (ref 13.0–18.0)
LYMPHS ABS: 0.7 10*3/uL — AB (ref 1.0–3.6)
LYMPHS PCT: 16 %
MCH: 30.2 pg (ref 26.0–34.0)
MCHC: 34 g/dL (ref 32.0–36.0)
MCV: 89 fL (ref 80.0–100.0)
Monocytes Absolute: 0.4 10*3/uL (ref 0.2–1.0)
Monocytes Relative: 9 %
NEUTROS ABS: 3.2 10*3/uL (ref 1.4–6.5)
NEUTROS PCT: 72 %
PLATELETS: 106 10*3/uL — AB (ref 150–440)
RBC: 4.73 MIL/uL (ref 4.40–5.90)
RDW: 14.5 % (ref 11.5–14.5)
WBC: 4.4 10*3/uL (ref 3.8–10.6)

## 2016-02-22 LAB — APTT: aPTT: 29 seconds (ref 24–36)

## 2016-02-22 LAB — C-REACTIVE PROTEIN

## 2016-02-22 LAB — PROTIME-INR
INR: 1.26
Prothrombin Time: 15.9 seconds — ABNORMAL HIGH (ref 11.4–15.0)

## 2016-02-22 NOTE — Progress Notes (Signed)
Spoke with patient in office today and provided pt with verbal and written instructions diabetic prep for pet scan. The patient gave verbal understanding that he will be notified as soon as possible regarding his upcoming liver biopsy. He was instructed to hold his aspirin, meloxicam and fish oil starting today. Teach back process performed.

## 2016-02-22 NOTE — Patient Instructions (Signed)
PET Scan A PET scan, also called positron emission tomography, is a test that creates pictures of the inside of the body. A PET scan requires a small dose of a harmless radioactive material to be injected into a vein. When this material combines with certain substances in the body, it produces tiny particles that can be detected by a scanner and converted into pictures.  The pictures created during a PET scan can be used to study a disease. They are often used to study cancer and cancer therapy. The colors and brightness on the pictures show different levels of organ and tissue function. For example, cancer tissue appears brighter than normal tissue on a PET scan picture. LET YOUR HEALTH CARE PROVIDER KNOW ABOUT:   Any allergies you have.  All medicines you are taking, including vitamins, herbs, eye drops, creams, and over-the-counter medicines.  Previous problems you or members of your family have had with the use of anesthetics.  Any blood disorders you have.  Previous surgeries you have had.  Medical conditions you have.  If you are afraid of cramped spaces (claustrophobic). If claustrophobia is a problem, it usually can be relieved with mild sedatives or antianxiety medicines.  If you have trouble staying still for long periods of time. BEFORE THE PROCEDURE   Do not eat or drink anything after midnight on the night before the procedure or as directed by your health care provider.  Take medicines only as directed by your health care provider.  If you have diabetes, ask your health care provider for diet guidelines to control sugar (glucose) levels on the day of the test. PROCEDURE   A small amount of radioactive material will be injected into a vein. The test will begin 30-60 minutes after the injection, when the material has traveled around your body.  You will lie on a cushioned table, and the table will be moved through the center of a machine that looks like a large donut. It  will take about 30-60 minutes for the machine to produce pictures of your body. You will need to stay still during this time. AFTER THE PROCEDURE  You may resume your normal diet and activities.  Drink several 8 oz glasses of water following the test to flush the radioactive material out of your body.   This information is not intended to replace advice given to you by your health care provider. Make sure you discuss any questions you have with your health care provider.   Document Released: 06/23/2003 Document Revised: 01/07/2015 Document Reviewed: 03/31/2014 Elsevier Interactive Patient Education 2016 [employer]  

## 2016-02-22 NOTE — Progress Notes (Signed)
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  male  Has been referred to [place] for further evaluation of  Pancreatic neck mass/.Multiple liver masses.  The imaging hasn't been done for mid abdominal pain.   Patient stated that  He had an episode of abdominal pain/ with nausea/ 10 pound weight loss in December 2016-  For which he had a colonoscopy;  Diagnosed with  diverticulitis Symptoms since that have improved.   however recently in the last 3-4 weeks he noted to have a vague abdominal pain  Periumbilical/ left abdomen.  1-2 on a scale of 10- at times radiates to the back.  Patient has not been taking any pain medication.  Patient however states that appetite is good. He has not lost any more weight. His not depressed.  ROS: Patient also complained of some chest pain approximately 2 weeks ago.  Otherwise denies any cough  Her shortness of breath. Denies any tingling and numbness in his feet. A complete 10 point review of system is done which is negative except mentioned above in history of present illness  MEDICAL HISTORY:  Past Medical History  Diagnosis Date  . Diabetes mellitus without complication ([place])   . Hypertension   . Hyperlipidemia     SURGICAL HISTORY: Past Surgical History  Procedure Laterality Date  . Hemorrhoid surgery    . Tonsillectomy and adenoidectomy    . Laminectomy    . Colonoscopy with propofol N/A 10/07/2015    Procedure: COLONOSCOPY WITH PROPOFOL;  Surgeon: Josefine Class, MD;  Location: [facility] ENDOSCOPY;  Service: Endoscopy;  Laterality: N/A;    SOCIAL HISTORY: Social History   Social History  .  Marital Status: Married    Spouse Name: N/A  . Number of Children: N/A  . Years of Education: N/A   Occupational History  . Not on file.   Social History Main Topics  . Smoking status: Former Smoker -- 1.00 packs/day for 5 years    Types: Cigarettes    Quit date: 01/01/1964  . Smokeless tobacco: Never Used  . Alcohol Use: No  . Drug Use: No  . Sexual Activity: Not on file   Other Topics Concern  . Not on file   Social History Narrative    FAMILY HISTORY: pancreatic ca/lung ca at 76y.  Family History  Problem Relation Age of Onset  . Diabetes Mother   . Hypertension Mother   . Stroke Mother   . Lung cancer Father   . Pancreatic cancer Father   . Diabetes Father   . Multiple sclerosis Brother   . Diabetes Brother   . Pneumonia Maternal Grandmother   . Pneumonia Maternal Grandfather   . Heart failure Paternal Grandmother   . Healthy Brother     ALLERGIES:  is allergic to prednisone.  MEDICATIONS:  Current Outpatient Prescriptions  Medication Sig Dispense Refill  . aspirin 81 MG tablet Take 81 mg by mouth daily.    . chlorpheniramine (ALLERGY) 4 MG tablet Take 1 tablet by mouth daily.    . Cinnamon 500 MG capsule Take 2,000 mg by mouth daily.    . ciprofloxacin (CIPRO) 500 MG tablet Take 1 tablet (500 mg total)  by mouth 2 (two) times daily. 14 tablet 0  . doxazosin (CARDURA) 4 MG tablet Take 4 mg by mouth daily.    Marland Kitchen GAVILYTE-N WITH FLAVOR PACK 420 G solution     . glucose blood (ACCU-CHEK AVIVA PLUS) test strip Use as instructed and check blood sugar once a day 100 each 12  . Insulin Glargine (LANTUS SOLOSTAR) 100 UNIT/ML Solostar Pen Inject 14 Units into the skin daily at 10 pm. 5 pen 5  . Lancets (ONETOUCH ULTRASOFT) lancets Daily.    . meloxicam (MOBIC) 15 MG tablet Take 15 mg by mouth daily.    . metFORMIN (GLUCOPHAGE) 500 MG tablet Take 2 tablets (1,000 mg total) by mouth 2 (two) times daily with a meal. 60 tablet 6  . metroNIDAZOLE (FLAGYL) 500 MG tablet  Take 1 tablet (500 mg total) by mouth 2 (two) times daily. 14 tablet 0  . Omega-3 Fatty Acids (FISH OIL) 1000 MG CAPS Take 4 capsules by mouth.    . ONGLYZA 5 MG TABS tablet TAKE ONE TABLET BY MOUTH ONCE DAILY 90 tablet 3  . pioglitazone (ACTOS) 15 MG tablet TAKE ONE TABLET BY MOUTH ONCE DAILY 30 tablet 6  . simvastatin (ZOCOR) 20 MG tablet Take 1 tablet by mouth  every day 90 tablet 3   No current facility-administered medications for this visit.      Marland Kitchen  PHYSICAL EXAMINATION: ECOG PERFORMANCE STATUS: 0 - Asymptomatic  Filed Vitals:   02/22/16 0955  BP: 147/86  Pulse: 78  Temp: 97.2 F (36.2 C)  Resp: 18   Filed Weights   02/22/16 0950  Weight: 169 lb 8.5 oz (76.9 kg)    GENERAL: Well-nourished well-developed; Alert, no distress and comfortable.  Alone. EYES: no pallor or icterus OROPHARYNX: no thrush or ulceration; good dentition  NECK: supple, no masses felt LYMPH:  no palpable lymphadenopathy in the cervical, axillary or inguinal regions LUNGS: clear to auscultation and  No wheeze or crackles HEART/CVS: regular rate & rhythm and no murmurs; No lower extremity edema ABDOMEN: abdomen soft, non-tender and normal bowel sounds Musculoskeletal:no cyanosis of digits and no clubbing  PSYCH: alert & oriented x 3 with fluent speech NEURO: no focal motor/sensory deficits SKIN:  no rashes or significant lesions  LABORATORY DATA:  I have reviewed the data as listed Lab Results  Component Value Date   WBC 5.0 02/14/2016   HCT 42.6 02/14/2016   MCV 88 02/14/2016   PLT 119* 02/14/2016    Recent Labs  09/15/15 0814 02/14/16 1429  NA 139 146*  K 4.4 4.5  CL 100 101  CO2 23 24  GLUCOSE 238* 142*  BUN 16 24  CREATININE 0.76 0.96  CALCIUM 9.4 10.6*  GFRNONAA 91 78  GFRAA 105 90  PROT 6.5 6.8  ALBUMIN 4.5 4.6  AST 16 37  ALT 17 43  ALKPHOS 38* 76  BILITOT 0.9 0.7    RADIOGRAPHIC STUDIES: I have personally reviewed the radiological images as listed and agreed  with the findings in the report. Ct Abdomen Pelvis W Contrast  02/20/2016  CLINICAL DATA:  Left mid and left lower quadrant abdominal pain and nausea for the past 3 weeks. Previous back surgery. EXAM: CT ABDOMEN AND PELVIS WITH CONTRAST TECHNIQUE: Multidetector CT imaging of the abdomen and pelvis was performed using the standard protocol following bolus administration of intravenous contrast. CONTRAST:  174mL OMNIPAQUE IOHEXOL 300 MG/ML  SOLN COMPARISON:  None. FINDINGS: Lower chest: 7 mm right posterior pleural lipoma on the  first image. No lung nodules. Small bulla at the left lung base. Small inferior pericardial effusion with a maximum thickness of 9 mm. Hepatobiliary: Multiple rounded and oval, poorly defined liver masses. The largest measures 3.8 x 2.7 cm on image number 14 of series 2. There is also a 1.3 cm oval mass with peripheral contrast puddling on the initial images and isodense to the remainder of the liver on the delayed images in the inferior aspect of the medial segment of the left lobe of the liver. Poorly distended gallbladder. Pancreas: On image number 22 of series 2 be, sagittal image number 68 and coronal image number 43, there is a small, subtle area of slightly lower density in the posterior aspect of the pancreatic neck and encasing the adjacent medial aspect of the splenic vein, causing severe narrowing of the lumen of the vein. This is also extending into the adjacent proximal portal vein. The pancreatic tail it is somewhat small relative to the remainder the pancreas with dilatation of the pancreatic duct in the tail. Spleen: Within normal limits in size and appearance. Adrenals/Urinary Tract: Calcifications in the superior aspects of both adrenal glands. There is also a left adrenal mass measuring 1.4 x 1.1 cm on image number 20 of series 2 and also well seen on coronal image number 54. Normal appearing kidneys and ureters. Mild diffuse bladder wall thickening. Stomach/Bowel:  Multiple sigmoid and descending colon diverticula. Normal appearing appendix. No gastric or small bowel abnormalities seen. Vascular/Lymphatic: There is a gastrohepatic ligament lymph node on image number 19 of series 2 with a short axis diameter of 10.5 mm. There is a nearby smaller, mildly enlarged gastrohepatic ligament lymph node more laterally. No other enlarged lymph nodes are seen. Reproductive: Moderately enlarged prostate gland. Other: Very small umbilical hernia containing fat. Musculoskeletal:  Lumbar spine degenerative changes. IMPRESSION: 1. Small, poorly defined mass in the inferior aspect of the pancreatic neck that is encasing the adjacent splenic vein, causing almost complete obstruction of the vein and extending into the adjacent portal vein. This is highly suspicious for a primary pancreatic neoplasm. This could be further evaluated with pre and postcontrast magnetic resonance imaging of the pancreas or endoscopic ultrasound. 2. Multiple liver metastases a single hemangioma. 3. 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 4. Moderately enlarged prostate gland. 5. Mild diffuse bladder wall thickening, compatible chronic bladder outlet obstruction by the enlarged prostate gland. 6. 1.4 cm left adrenal probable adenoma. A metastasis is less likely. 7. Colonic diverticulosis. 8. Calcifications in the superior aspects of both adrenal glands, compatible with previous hemorrhage. 9. Small inferior pericardial effusion. These results will be called to the ordering clinician or representative by the Radiologist Assistant, and communication documented in the PACS or zVision Dashboard. Electronically Signed   By: Claudie Revering M.D.   On: 02/20/2016 15:41    ASSESSMENT & PLAN:   #  Pancreatic mass/ multiple  Liver lesions-  This is highly concerning for pancreatic malignancy.  Recommend checking CBC CMP/ CA-19-9/ CRP.  Also recommend  Ultrasound  Guided biopsy of the  Right liver lobe lesion.   Discussed with radiology.  Also recommend PET scan.   #  Mild thrombocytopenia-  Unclear etiology.  Await CBC from today.  #  Patient will follow-up with me in approximately  One week or so;  To go over the treatment plan.   Thank you Vernie Murders, PA for allowing me to participate in the care of your pleasant patient. Please do not hesitate to contact  me with questions or concerns in the interim.  # 45 minutes face-to-face with the patient discussing the above plan of care; more than 50% of time spent on prognosis/ natural history; counseling and coordination.     Cammie Sickle, MD 02/22/2016 10:03 AM

## 2016-02-23 LAB — CANCER ANTIGEN 19-9: CA 19 9: 45279 U/mL — AB (ref 0–35)

## 2016-02-24 ENCOUNTER — Telehealth: Payer: Self-pay

## 2016-02-24 ENCOUNTER — Other Ambulatory Visit: Payer: Self-pay | Admitting: Internal Medicine

## 2016-02-24 DIAGNOSIS — C787 Secondary malignant neoplasm of liver and intrahepatic bile duct: Principal | ICD-10-CM

## 2016-02-24 DIAGNOSIS — C259 Malignant neoplasm of pancreas, unspecified: Secondary | ICD-10-CM

## 2016-02-24 MED ORDER — MORPHINE SULFATE 15 MG PO TABS: 15.0000 mg | ORAL_TABLET | ORAL | Status: DC | PRN

## 2016-02-24 NOTE — Telephone Encounter (Signed)
  Oncology Nurse Navigator Documentation  Navigator Location: [facility] (02/24/16 1300) Navigator Encounter Type: Telephone (02/24/16 1300)                                          Time Spent with Patient: 15 (02/24/16 1300)   Notified that prescription is available for pick up at front desk.

## 2016-02-24 NOTE — Telephone Encounter (Signed)
  Oncology Nurse Navigator Documentation  Navigator Location: [facility] (02/24/16 1000) Navigator Encounter Type: Telephone (02/24/16 1000) Telephone: Incoming Call;Symptom Mgt (02/24/16 1000)                                        Time Spent with Patient: 15 (02/24/16 1000)   Received call from Jared Paul, He is now having an increase in abdominal pain and is requesting medication. Danley Danker RN notified and will speak with Dr Rogue Bussing.

## 2016-02-27 ENCOUNTER — Ambulatory Visit
Admission: RE | Admit: 2016-02-27 | Discharge: 2016-02-27 | Disposition: A | Discharge status: Home / Self Care | Payer: Medicare Other | Source: Ambulatory Visit | Attending: Internal Medicine | Admitting: Internal Medicine

## 2016-02-27 ENCOUNTER — Telehealth: Payer: Self-pay

## 2016-02-27 ENCOUNTER — Other Ambulatory Visit: Payer: Self-pay | Admitting: General Surgery

## 2016-02-27 ENCOUNTER — Other Ambulatory Visit: Payer: Self-pay | Admitting: Radiology

## 2016-02-27 DIAGNOSIS — R351 Nocturia: Secondary | ICD-10-CM | POA: Diagnosis not present

## 2016-02-27 DIAGNOSIS — C227 Other specified carcinomas of liver: Secondary | ICD-10-CM | POA: Diagnosis not present

## 2016-02-27 DIAGNOSIS — K5730 Diverticulosis of large intestine without perforation or abscess without bleeding: Secondary | ICD-10-CM | POA: Insufficient documentation

## 2016-02-27 DIAGNOSIS — N400 Enlarged prostate without lower urinary tract symptoms: Secondary | ICD-10-CM | POA: Insufficient documentation

## 2016-02-27 DIAGNOSIS — M4806 Spinal stenosis, lumbar region: Secondary | ICD-10-CM | POA: Insufficient documentation

## 2016-02-27 DIAGNOSIS — E785 Hyperlipidemia, unspecified: Secondary | ICD-10-CM | POA: Insufficient documentation

## 2016-02-27 DIAGNOSIS — K8689 Other specified diseases of pancreas: Secondary | ICD-10-CM | POA: Diagnosis not present

## 2016-02-27 DIAGNOSIS — Z01812 Encounter for preprocedural laboratory examination: Secondary | ICD-10-CM | POA: Insufficient documentation

## 2016-02-27 DIAGNOSIS — C250 Malignant neoplasm of head of pancreas: Secondary | ICD-10-CM | POA: Insufficient documentation

## 2016-02-27 DIAGNOSIS — H9313 Tinnitus, bilateral: Secondary | ICD-10-CM | POA: Diagnosis not present

## 2016-02-27 DIAGNOSIS — E119 Type 2 diabetes mellitus without complications: Secondary | ICD-10-CM | POA: Insufficient documentation

## 2016-02-27 DIAGNOSIS — K7689 Other specified diseases of liver: Secondary | ICD-10-CM | POA: Diagnosis present

## 2016-02-27 DIAGNOSIS — Z01818 Encounter for other preprocedural examination: Secondary | ICD-10-CM

## 2016-02-27 DIAGNOSIS — K769 Liver disease, unspecified: Secondary | ICD-10-CM

## 2016-02-27 LAB — GLUCOSE, CAPILLARY: Glucose-Capillary: 77 mg/dL (ref 65–99)

## 2016-02-27 MED ORDER — FLUDEOXYGLUCOSE F - 18 (FDG) INJECTION
12.1600 | Freq: Once | INTRAVENOUS | Status: AC | PRN
  Administered 2016-02-27: 12.16 via INTRAVENOUS

## 2016-02-27 NOTE — Telephone Encounter (Signed)
  Oncology Nurse Navigator Documentation  Navigator Location: [facility] (02/27/16 1400) Navigator Encounter Type: Telephone (02/27/16 1400)                                          Time Spent with Patient: 15 (02/27/16 1400)   Jared Paul left voicemail with questions regarding liver biopsy. He has since been contacted by special procedures and they answered all of his questions.

## 2016-02-28 ENCOUNTER — Ambulatory Visit
Admission: RE | Admit: 2016-02-28 | Discharge: 2016-02-28 | Disposition: A | Discharge status: Home / Self Care | Payer: Medicare Other | Source: Ambulatory Visit | Attending: Internal Medicine | Admitting: Internal Medicine

## 2016-02-28 DIAGNOSIS — E785 Hyperlipidemia, unspecified: Secondary | ICD-10-CM | POA: Diagnosis not present

## 2016-02-28 DIAGNOSIS — E119 Type 2 diabetes mellitus without complications: Secondary | ICD-10-CM | POA: Diagnosis not present

## 2016-02-28 DIAGNOSIS — C250 Malignant neoplasm of head of pancreas: Secondary | ICD-10-CM | POA: Diagnosis not present

## 2016-02-28 DIAGNOSIS — N400 Enlarged prostate without lower urinary tract symptoms: Secondary | ICD-10-CM | POA: Diagnosis not present

## 2016-02-28 DIAGNOSIS — Z01812 Encounter for preprocedural laboratory examination: Secondary | ICD-10-CM | POA: Diagnosis not present

## 2016-02-28 DIAGNOSIS — C787 Secondary malignant neoplasm of liver and intrahepatic bile duct: Secondary | ICD-10-CM | POA: Diagnosis not present

## 2016-02-28 DIAGNOSIS — Z01818 Encounter for other preprocedural examination: Secondary | ICD-10-CM

## 2016-02-28 DIAGNOSIS — K769 Liver disease, unspecified: Secondary | ICD-10-CM

## 2016-02-28 DIAGNOSIS — C257 Malignant neoplasm of other parts of pancreas: Secondary | ICD-10-CM | POA: Diagnosis not present

## 2016-02-28 DIAGNOSIS — K5730 Diverticulosis of large intestine without perforation or abscess without bleeding: Secondary | ICD-10-CM | POA: Diagnosis not present

## 2016-02-28 DIAGNOSIS — M4806 Spinal stenosis, lumbar region: Secondary | ICD-10-CM | POA: Diagnosis not present

## 2016-02-28 DIAGNOSIS — K7689 Other specified diseases of liver: Secondary | ICD-10-CM | POA: Diagnosis not present

## 2016-02-28 DIAGNOSIS — C227 Other specified carcinomas of liver: Secondary | ICD-10-CM | POA: Diagnosis not present

## 2016-02-28 DIAGNOSIS — R351 Nocturia: Secondary | ICD-10-CM | POA: Diagnosis not present

## 2016-02-28 DIAGNOSIS — H9313 Tinnitus, bilateral: Secondary | ICD-10-CM | POA: Diagnosis not present

## 2016-02-28 HISTORY — DX: Unspecified osteoarthritis, unspecified site: M19.90

## 2016-02-28 LAB — PROTIME-INR
INR: 1.24
Prothrombin Time: 15.8 seconds — ABNORMAL HIGH (ref 11.4–15.0)

## 2016-02-28 LAB — CBC
HEMATOCRIT: 40.8 % (ref 40.0–52.0)
HEMOGLOBIN: 13.9 g/dL (ref 13.0–18.0)
MCH: 29.9 pg (ref 26.0–34.0)
MCHC: 34 g/dL (ref 32.0–36.0)
MCV: 88 fL (ref 80.0–100.0)
Platelets: 108 10*3/uL — ABNORMAL LOW (ref 150–440)
RBC: 4.63 MIL/uL (ref 4.40–5.90)
RDW: 14.5 % (ref 11.5–14.5)
WBC: 4.3 10*3/uL (ref 3.8–10.6)

## 2016-02-28 LAB — APTT: aPTT: 25 seconds (ref 24–36)

## 2016-02-28 MED ORDER — MIDAZOLAM HCL 5 MG/5ML IJ SOLN
INTRAMUSCULAR | Status: AC
  Filled 2016-02-28: qty 10

## 2016-02-28 MED ORDER — FENTANYL CITRATE (PF) 100 MCG/2ML IJ SOLN
INTRAMUSCULAR | Status: AC
  Filled 2016-02-28: qty 4

## 2016-02-28 MED ORDER — HYDROCODONE-ACETAMINOPHEN 5-325 MG PO TABS
1.0000 | ORAL_TABLET | ORAL | Status: DC | PRN
  Administered 2016-02-28: 2 via ORAL

## 2016-02-28 MED ORDER — HYDROCODONE-ACETAMINOPHEN 5-325 MG PO TABS
ORAL_TABLET | ORAL | Status: AC
  Filled 2016-02-28: qty 2

## 2016-02-28 MED ORDER — MIDAZOLAM HCL 5 MG/5ML IJ SOLN
INTRAMUSCULAR | Status: AC | PRN
  Administered 2016-02-28 (×2): 1 mg via INTRAVENOUS

## 2016-02-28 MED ORDER — FENTANYL CITRATE (PF) 100 MCG/2ML IJ SOLN
INTRAMUSCULAR | Status: AC | PRN
  Administered 2016-02-28: 50 ug via INTRAVENOUS
  Administered 2016-02-28: 25 ug via INTRAVENOUS

## 2016-02-28 MED ORDER — SODIUM CHLORIDE 0.9 % IV SOLN
Freq: Once | INTRAVENOUS | Status: AC
  Administered 2016-02-28: 11:00:00 via INTRAVENOUS

## 2016-02-28 NOTE — Procedures (Signed)
Procedure and risks discussed with patient. Informed consent obtained.

## 2016-02-28 NOTE — CV Procedure (Signed)
Under US guidance, biopsy was attempted of right hepatic lobe lesion. 2 samples were obtained, but this was technically difficult due to depth of lesion, and it is uncertain if actual lesion was sampled.

## 2016-02-29 LAB — SURGICAL PATHOLOGY

## 2016-03-01 ENCOUNTER — Ambulatory Visit: Payer: Medicare Other

## 2016-03-01 ENCOUNTER — Inpatient Hospital Stay: Payer: Medicare Other | Admitting: Internal Medicine

## 2016-03-01 ENCOUNTER — Inpatient Hospital Stay: Payer: Medicare Other | Attending: Internal Medicine | Admitting: Internal Medicine

## 2016-03-01 VITALS — BP 151/81 | HR 95 | Temp 97.0°F | Resp 18 | Ht 70.0 in | Wt 167.5 lb

## 2016-03-01 DIAGNOSIS — N400 Enlarged prostate without lower urinary tract symptoms: Secondary | ICD-10-CM | POA: Diagnosis not present

## 2016-03-01 DIAGNOSIS — I10 Essential (primary) hypertension: Secondary | ICD-10-CM | POA: Diagnosis not present

## 2016-03-01 DIAGNOSIS — Z801 Family history of malignant neoplasm of trachea, bronchus and lung: Secondary | ICD-10-CM | POA: Diagnosis not present

## 2016-03-01 DIAGNOSIS — M1990 Unspecified osteoarthritis, unspecified site: Secondary | ICD-10-CM | POA: Diagnosis not present

## 2016-03-01 DIAGNOSIS — Z85828 Personal history of other malignant neoplasm of skin: Secondary | ICD-10-CM | POA: Insufficient documentation

## 2016-03-01 DIAGNOSIS — M549 Dorsalgia, unspecified: Secondary | ICD-10-CM | POA: Diagnosis not present

## 2016-03-01 DIAGNOSIS — C787 Secondary malignant neoplasm of liver and intrahepatic bile duct: Secondary | ICD-10-CM | POA: Insufficient documentation

## 2016-03-01 DIAGNOSIS — C257 Malignant neoplasm of other parts of pancreas: Secondary | ICD-10-CM | POA: Insufficient documentation

## 2016-03-01 DIAGNOSIS — Z794 Long term (current) use of insulin: Secondary | ICD-10-CM | POA: Insufficient documentation

## 2016-03-01 DIAGNOSIS — R110 Nausea: Secondary | ICD-10-CM | POA: Diagnosis not present

## 2016-03-01 DIAGNOSIS — C259 Malignant neoplasm of pancreas, unspecified: Secondary | ICD-10-CM

## 2016-03-01 DIAGNOSIS — R630 Anorexia: Secondary | ICD-10-CM | POA: Diagnosis not present

## 2016-03-01 DIAGNOSIS — E785 Hyperlipidemia, unspecified: Secondary | ICD-10-CM | POA: Insufficient documentation

## 2016-03-01 DIAGNOSIS — Z79899 Other long term (current) drug therapy: Secondary | ICD-10-CM | POA: Insufficient documentation

## 2016-03-01 DIAGNOSIS — R1032 Left lower quadrant pain: Secondary | ICD-10-CM

## 2016-03-01 DIAGNOSIS — Z800 Family history of malignant neoplasm of digestive organs: Secondary | ICD-10-CM | POA: Diagnosis not present

## 2016-03-01 DIAGNOSIS — Z7982 Long term (current) use of aspirin: Secondary | ICD-10-CM | POA: Diagnosis not present

## 2016-03-01 DIAGNOSIS — Z87891 Personal history of nicotine dependence: Secondary | ICD-10-CM | POA: Diagnosis not present

## 2016-03-01 DIAGNOSIS — Z7984 Long term (current) use of oral hypoglycemic drugs: Secondary | ICD-10-CM

## 2016-03-01 DIAGNOSIS — R785 Finding of other psychotropic drug in blood: Secondary | ICD-10-CM

## 2016-03-01 DIAGNOSIS — R634 Abnormal weight loss: Secondary | ICD-10-CM | POA: Diagnosis not present

## 2016-03-01 DIAGNOSIS — E119 Type 2 diabetes mellitus without complications: Secondary | ICD-10-CM | POA: Diagnosis not present

## 2016-03-01 MED ORDER — FENTANYL 25 MCG/HR TD PT72: 25.0000 ug | MEDICATED_PATCH | TRANSDERMAL | Status: DC

## 2016-03-01 NOTE — Progress Notes (Signed)
[place] NOTE  Patient Care Team: Margo Common, PA as PCP - General (Physician Assistant) Clent Jacks, RN as Registered Nurse  CHIEF COMPLAINTS/PURPOSE OF CONSULTATION: pancreatic cancer  # FEB 2017- METASTATIC PANCREATIC CANCER- with liver metastasis [Liver Bx- US]Pancreatic neck mass invasion of splenic/portal vasculature; Liver masses- multiple ~ 3.2x2.4cm [largest]; Ca-19-9- 45,000  HISTORY OF PRESENTING ILLNESS:  Jared Paul 75 y.o.  male patient with recently diagnosed multiple liver lesions- is here to review the results of his liver biopsy/PET scan. Patient continues to have abdominal pain/back pain. Appetite is poor.; No nausea no vomiting. Losing weight.  Patient has not been taking his pain medication on a regular basis. He is taking morphine sulfate once a day.he denies any skin itching or skin rash.  ROS:  Otherwise denies any cough  Her shortness of breath. Denies any tingling and numbness in his feet. A complete 10 point review of system is done which is negative except mentioned above in history of present illness  MEDICAL HISTORY:  Past Medical History  Diagnosis Date  . Diabetes mellitus without complication ([place])   . Hypertension   . Hyperlipidemia   . Skin cancer   . Pancreatic mass   . Arthritis     SURGICAL HISTORY: Past Surgical History  Procedure Laterality Date  . Hemorrhoid surgery    . Tonsillectomy and adenoidectomy    . Laminectomy    . Colonoscopy with propofol N/A 10/07/2015    Procedure: COLONOSCOPY WITH PROPOFOL;  Surgeon: Josefine Class, MD;  Location: [facility] ENDOSCOPY;  Service: Endoscopy;  Laterality: N/A;    SOCIAL HISTORY: Social History   Social History  . Marital Status: Married    Spouse Name: N/A  . Number of Children: N/A  . Years of Education: N/A   Occupational History  . Not on file.   Social History Main Topics  . Smoking status: Former Smoker -- 1.00 packs/day for 5 years     Types: Cigarettes    Quit date: 01/01/1964  . Smokeless tobacco: Never Used  . Alcohol Use: No  . Drug Use: No  . Sexual Activity: Not on file   Other Topics Concern  . Not on file   Social History Narrative    FAMILY HISTORY: pancreatic ca/lung ca at 76y.  Family History  Problem Relation Age of Onset  . Diabetes Mother   . Hypertension Mother   . Stroke Mother   . Lung cancer Father 56  . Pancreatic cancer Father 49  . Diabetes Father   . Multiple sclerosis Brother   . Diabetes Brother   . Pneumonia Maternal Grandmother   . Pneumonia Maternal Grandfather   . Heart failure Paternal Grandmother   . Healthy Brother     ALLERGIES:  is allergic to prednisone.  MEDICATIONS:  Current Outpatient Prescriptions  Medication Sig Dispense Refill  . aspirin 81 MG tablet Take 81 mg by mouth daily.    . chlorpheniramine (ALLERGY) 4 MG tablet Take 1 tablet by mouth daily. Reported on 02/28/2016    . Cinnamon 500 MG capsule Take 2,000 mg by mouth daily.    Marland Kitchen doxazosin (CARDURA) 4 MG tablet Take 1 tablet by mouth  daily 90 tablet 3  . GAVILYTE-N WITH FLAVOR PACK 420 G solution Reported on 02/28/2016    . glucose blood (ACCU-CHEK AVIVA PLUS) test strip Use as instructed and check blood sugar once a day 100 each 12  . Insulin Glargine (LANTUS SOLOSTAR) 100 UNIT/ML  Solostar Pen Inject 14 Units into the skin daily at 10 pm. 5 pen 5  . Lancets (ONETOUCH ULTRASOFT) lancets Daily.    . meloxicam (MOBIC) 15 MG tablet Take 15 mg by mouth daily.    . metFORMIN (GLUCOPHAGE) 500 MG tablet Take 2 tablets (1,000 mg total) by mouth 2 (two) times daily with a meal. 60 tablet 6  . metroNIDAZOLE (FLAGYL) 500 MG tablet Take 1 tablet (500 mg total) by mouth 2 (two) times daily. 14 tablet 0  . morphine (MSIR) 15 MG tablet Take 1 tablet (15 mg total) by mouth every 4 (four) hours as needed for severe pain. 30 tablet 0  . Omega-3 Fatty Acids (FISH OIL) 1000 MG CAPS Take 4 capsules by mouth.    . ONGLYZA 5 MG  TABS tablet TAKE ONE TABLET BY MOUTH ONCE DAILY 90 tablet 3  . pioglitazone (ACTOS) 15 MG tablet TAKE ONE TABLET BY MOUTH ONCE DAILY 30 tablet 6  . simvastatin (ZOCOR) 20 MG tablet Take 1 tablet by mouth  every day 90 tablet 3  . ciprofloxacin (CIPRO) 500 MG tablet Take 1 tablet (500 mg total) by mouth 2 (two) times daily. 14 tablet 0  . fentaNYL (DURAGESIC - DOSED MCG/HR) 25 MCG/HR patch Place 1 patch (25 mcg total) onto the skin every 3 (three) days. 10 patch 0   No current facility-administered medications for this visit.      Marland Kitchen  PHYSICAL EXAMINATION: ECOG PERFORMANCE STATUS: 0 - Asymptomatic  Filed Vitals:   03/01/16 1450  BP: 151/81  Pulse: 95  Temp: 97 F (36.1 C)  Resp: 18   Filed Weights   03/01/16 1450  Weight: 167 lb 8.8 oz (76 kg)    GENERAL: Well-nourished well-developed; Alert, no distress and comfortable.  Alone. EYES: no pallor or icterus OROPHARYNX: no thrush or ulceration; good dentition  NECK: supple, no masses felt LYMPH:  no palpable lymphadenopathy in the cervical, axillary or inguinal regions LUNGS: clear to auscultation and  No wheeze or crackles HEART/CVS: regular rate & rhythm and no murmurs; No lower extremity edema ABDOMEN: abdomen soft, non-tender and normal bowel sounds Musculoskeletal:no cyanosis of digits and no clubbing  PSYCH: alert & oriented x 3 with fluent speech NEURO: no focal motor/sensory deficits SKIN:  no rashes or significant lesions  LABORATORY DATA:  I have reviewed the data as listed Lab Results  Component Value Date   WBC 4.3 02/28/2016   HGB 13.9 02/28/2016   HCT 40.8 02/28/2016   MCV 88.0 02/28/2016   PLT 108* 02/28/2016    Recent Labs  09/15/15 0814 02/14/16 1429 02/22/16 1100  NA 139 146* 136  K 4.4 4.5 3.8  CL 100 101 103  CO2 23 24 27   GLUCOSE 238* 142* 192*  BUN 16 24 26*  CREATININE 0.76 0.96 0.74  CALCIUM 9.4 10.6* 9.1  GFRNONAA 91 78 >60  GFRAA 105 90 >60  PROT 6.5 6.8 6.8  ALBUMIN 4.5 4.6 4.0   AST 16 37 50*  ALT 17 43 64*  ALKPHOS 38* 76 79  BILITOT 0.9 0.7 0.8    RADIOGRAPHIC STUDIES: I have personally reviewed the radiological images as listed and agreed with the findings in the report. Ct Abdomen Pelvis W Contrast  02/20/2016  CLINICAL DATA:  Left mid and left lower quadrant abdominal pain and nausea for the past 3 weeks. Previous back surgery. EXAM: CT ABDOMEN AND PELVIS WITH CONTRAST TECHNIQUE: Multidetector CT imaging of the abdomen and pelvis was performed using  the standard protocol following bolus administration of intravenous contrast. CONTRAST:  125mL OMNIPAQUE IOHEXOL 300 MG/ML  SOLN COMPARISON:  None. FINDINGS: Lower chest: 7 mm right posterior pleural lipoma on the first image. No lung nodules. Small bulla at the left lung base. Small inferior pericardial effusion with a maximum thickness of 9 mm. Hepatobiliary: Multiple rounded and oval, poorly defined liver masses. The largest measures 3.8 x 2.7 cm on image number 14 of series 2. There is also a 1.3 cm oval mass with peripheral contrast puddling on the initial images and isodense to the remainder of the liver on the delayed images in the inferior aspect of the medial segment of the left lobe of the liver. Poorly distended gallbladder. Pancreas: On image number 22 of series 2 be, sagittal image number 68 and coronal image number 43, there is a small, subtle area of slightly lower density in the posterior aspect of the pancreatic neck and encasing the adjacent medial aspect of the splenic vein, causing severe narrowing of the lumen of the vein. This is also extending into the adjacent proximal portal vein. The pancreatic tail it is somewhat small relative to the remainder the pancreas with dilatation of the pancreatic duct in the tail. Spleen: Within normal limits in size and appearance. Adrenals/Urinary Tract: Calcifications in the superior aspects of both adrenal glands. There is also a left adrenal mass measuring 1.4 x 1.1 cm  on image number 20 of series 2 and also well seen on coronal image number 54. Normal appearing kidneys and ureters. Mild diffuse bladder wall thickening. Stomach/Bowel: Multiple sigmoid and descending colon diverticula. Normal appearing appendix. No gastric or small bowel abnormalities seen. Vascular/Lymphatic: There is a gastrohepatic ligament lymph node on image number 19 of series 2 with a short axis diameter of 10.5 mm. There is a nearby smaller, mildly enlarged gastrohepatic ligament lymph node more laterally. No other enlarged lymph nodes are seen. Reproductive: Moderately enlarged prostate gland. Other: Very small umbilical hernia containing fat. Musculoskeletal:  Lumbar spine degenerative changes. IMPRESSION: 1. Small, poorly defined mass in the inferior aspect of the pancreatic neck that is encasing the adjacent splenic vein, causing almost complete obstruction of the vein and extending into the adjacent portal vein. This is highly suspicious for a primary pancreatic neoplasm. This could be further evaluated with pre and postcontrast magnetic resonance imaging of the pancreas or endoscopic ultrasound. 2. Multiple liver metastases a single hemangioma. 3. 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 4. Moderately enlarged prostate gland. 5. Mild diffuse bladder wall thickening, compatible chronic bladder outlet obstruction by the enlarged prostate gland. 6. 1.4 cm left adrenal probable adenoma. A metastasis is less likely. 7. Colonic diverticulosis. 8. Calcifications in the superior aspects of both adrenal glands, compatible with previous hemorrhage. 9. Small inferior pericardial effusion. These results will be called to the ordering clinician or representative by the Radiologist Assistant, and communication documented in the PACS or zVision Dashboard. Electronically Signed   By: Claudie Revering M.D.   On: 02/20/2016 15:41   Nm Pet Image Initial (pi) Skull Base To Thigh  02/27/2016  CLINICAL DATA:   Initial treatment strategy for pancreatic head mass. EXAM: NUCLEAR MEDICINE PET SKULL BASE TO THIGH TECHNIQUE: 12.2 mCi F-18 FDG was injected intravenously. Full-ring PET imaging was performed from the skull base to thigh after the radiotracer. CT data was obtained and used for attenuation correction and anatomic localization. FASTING BLOOD GLUCOSE:  Value: 77 mg/dl COMPARISON:  CT scan from 02/20/2016 FINDINGS: NECK No hypermetabolic lymph  nodes in the neck. CHEST No hypermetabolic mediastinal or hilar nodes. No suspicious pulmonary nodules on the CT scan. ABDOMEN/PELVIS As seen on the recent CT scan, there are multiple poorly defined lesions in the liver. Index 3.1 cm lesion towards the dome is hypermetabolic with SUV max = 9.6. 2.3 cm posterior right hepatic lesion (Segment VI) has SUV max = 7.4. The area of abnormal pancreas seen on the recent CT scan shows only mild hypermetabolism with SUV max = 3.1 in the region of the lesion causing the splenic vein attenuation. No evidence for hypermetabolic lymphadenopathy in the abdomen or pelvis. Scattered areas of FDG accumulation are identified in the colon, but these are distributed along the length of the colon and have imaging features most suggestive of physiologic uptake. Diffuse diverticular changes seen in the left colon without features of diverticulitis. SKELETON No focal hypermetabolic activity to suggest skeletal metastasis. IMPRESSION: 1. The lesion in the pancreatic neck is mildly hypermetabolic. 2. Multiple hepatic lesions are hypermetabolic consistent with metastatic disease. 3. No evidence for hypermetabolic metastases in the chest or pelvis. Electronically Signed   By: Misty Stanley M.D.   On: 02/27/2016 10:02   US Biopsy  02/28/2016  INDICATION: Right hepatic lesion. EXAM: ULTRASOUND BIOPSY CORE LIVER MEDICATIONS: None. ANESTHESIA/SEDATION: Moderate (conscious) sedation was employed during this procedure. A total of Versed 2.0 mg and Fentanyl 75  mcg was administered intravenously. Moderate Sedation Time: 23 minutes. The patient's level of consciousness and vital signs were monitored continuously by radiology nursing throughout the procedure under my direct supervision. COMPLICATIONS: None immediate. PROCEDURE: Informed written consent was obtained from the patient after a thorough discussion of the procedural risks, benefits and alternatives. All questions were addressed. Maximal Sterile Barrier Technique was utilized including sterile gloves, sterile drape, hand hygiene and skin antiseptic. A timeout was performed prior to the initiation of the procedure. Under real-time ultrasound guidance, 17 gauge guiding needle was directed into right hepatic lesion. Two core samples were obtained using 18 gauge biopsy needle. These were placed in sample vial. This was technically difficult due to the depth of the lesion. Guiding needle was accidentally withdrawn prior to Gel-Foam administration. Appropriate dressing was applied. No immediate complications were noted. IMPRESSION: Under real-time ultrasound guidance, core biopsy was performed of right hepatic lesion. This was technically difficult due to depth of the lesion, and if samples demonstrate normal hepatic parenchyma, repeat biopsy under CT guidance is recommended. Electronically Signed   By: Marijo Conception, M.D.   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 I reviewed the staging and pathology with the patient and wife in detail. I reviewed the images myself/reviewed with patient and wife in detail.  # I discussed the incurable nature of the disease;and in general the left expectancy is anywhere between 6-12 months. Chemotherapy would be offered to to slow down the growth of the disease.  # I discussed the various chemotherapy options including FOLFIRINOX every 2 weeks #2 gemcitabine and Abraxane every 2 weeks. Discussed  that the response rates with FOLFIRINOX is approximately 30%; disease control rate is about 70%; whereas with gemcitabine-Abraxane the response rates are about 20% to 25%; disease control rate is about 50%. Expectantly, the side effects of FOLFIRINOX are more intense- including more neutropenia; fatigue and diarrhea.   # after lengthy discussion patient states that he is seeing his father go through chemotherapy/side effects; and he is leaning towards best supportive  care/hospice. Patient has not made any decisions yet. He has a friend who works for hospice. I have offered to talk to his daughter; he states that he will have his daughter give me a call if that is needed.  # pain control- recommend adding fentanyl patch 25 g along with his morphine short-acting pain medication. Again discussed the potential side effects.  # also left a message for [name] Chrismon patient's PCP to discuss the above recommendations.  # No follow-up appointments are made at this time; I'm waiting to hear from the patient regarding the next plan of care.  # 40 minutes face-to-face with the patient discussing the above plan of care; more than 50% of time spent on prognosis/ natural history; counseling and coordination.      Cammie Sickle, MD 03/01/2016 4:30 PM

## 2016-03-06 ENCOUNTER — Telehealth: Payer: Self-pay | Admitting: *Deleted

## 2016-03-06 ENCOUNTER — Other Ambulatory Visit: Payer: Self-pay | Admitting: Family Medicine

## 2016-03-06 ENCOUNTER — Telehealth: Payer: Self-pay

## 2016-03-06 MED ORDER — PROCHLORPERAZINE MALEATE 10 MG PO TABS: ORAL_TABLET | ORAL | Status: AC

## 2016-03-06 NOTE — Telephone Encounter (Signed)
  Oncology Nurse Navigator Documentation  Navigator Location: [facility] (03/06/16 0900) Navigator Encounter Type: Telephone (03/06/16 0900)                                          Time Spent with Patient: 15 (03/06/16 0900)   Received call from Jared Paul. He states he has decided not to receive chemotherapy for newly diagnosed pancreatic cancer. Will arrange for appt with Dr Rogue Bussing. He is also having increased nausea and needs script for antiemetic called into pharmacy.

## 2016-03-06 NOTE — Telephone Encounter (Signed)
rcvd message from [place] that pt having nausea and needs medication for it.  She also states that pt made decision not to take chemo and would need appt with Dr. B to see pt and discuss future plans since pt has made decision about treatment.  Called pt's house and spoke to wife and told her that we got message about pt making decision of no chemo.  Sent in rx for nausea med compazine.  Made appt for Friday at 11:45 and they ae agreeable to this.

## 2016-03-06 NOTE — Telephone Encounter (Signed)
Please see telephone note in regards to the inbasket about pt

## 2016-03-09 ENCOUNTER — Inpatient Hospital Stay (HOSPITAL_BASED_OUTPATIENT_CLINIC_OR_DEPARTMENT_OTHER): Payer: Medicare Other | Admitting: Internal Medicine

## 2016-03-09 VITALS — BP 139/87 | HR 81 | Temp 97.8°F | Resp 18 | Wt 163.4 lb

## 2016-03-09 DIAGNOSIS — R630 Anorexia: Secondary | ICD-10-CM

## 2016-03-09 DIAGNOSIS — E785 Hyperlipidemia, unspecified: Secondary | ICD-10-CM | POA: Diagnosis not present

## 2016-03-09 DIAGNOSIS — R1032 Left lower quadrant pain: Secondary | ICD-10-CM | POA: Diagnosis not present

## 2016-03-09 DIAGNOSIS — Z85828 Personal history of other malignant neoplasm of skin: Secondary | ICD-10-CM | POA: Diagnosis not present

## 2016-03-09 DIAGNOSIS — Z7984 Long term (current) use of oral hypoglycemic drugs: Secondary | ICD-10-CM

## 2016-03-09 DIAGNOSIS — N400 Enlarged prostate without lower urinary tract symptoms: Secondary | ICD-10-CM | POA: Diagnosis not present

## 2016-03-09 DIAGNOSIS — I10 Essential (primary) hypertension: Secondary | ICD-10-CM | POA: Diagnosis not present

## 2016-03-09 DIAGNOSIS — Z79899 Other long term (current) drug therapy: Secondary | ICD-10-CM | POA: Diagnosis not present

## 2016-03-09 DIAGNOSIS — E119 Type 2 diabetes mellitus without complications: Secondary | ICD-10-CM | POA: Diagnosis not present

## 2016-03-09 DIAGNOSIS — Z800 Family history of malignant neoplasm of digestive organs: Secondary | ICD-10-CM | POA: Diagnosis not present

## 2016-03-09 DIAGNOSIS — C787 Secondary malignant neoplasm of liver and intrahepatic bile duct: Secondary | ICD-10-CM

## 2016-03-09 DIAGNOSIS — Z794 Long term (current) use of insulin: Secondary | ICD-10-CM

## 2016-03-09 DIAGNOSIS — R110 Nausea: Secondary | ICD-10-CM

## 2016-03-09 DIAGNOSIS — Z87891 Personal history of nicotine dependence: Secondary | ICD-10-CM | POA: Diagnosis not present

## 2016-03-09 DIAGNOSIS — M549 Dorsalgia, unspecified: Secondary | ICD-10-CM

## 2016-03-09 DIAGNOSIS — C257 Malignant neoplasm of other parts of pancreas: Secondary | ICD-10-CM

## 2016-03-09 DIAGNOSIS — M1990 Unspecified osteoarthritis, unspecified site: Secondary | ICD-10-CM | POA: Diagnosis not present

## 2016-03-09 DIAGNOSIS — Z801 Family history of malignant neoplasm of trachea, bronchus and lung: Secondary | ICD-10-CM | POA: Diagnosis not present

## 2016-03-09 DIAGNOSIS — C259 Malignant neoplasm of pancreas, unspecified: Secondary | ICD-10-CM

## 2016-03-09 DIAGNOSIS — R634 Abnormal weight loss: Secondary | ICD-10-CM

## 2016-03-09 DIAGNOSIS — Z7982 Long term (current) use of aspirin: Secondary | ICD-10-CM | POA: Diagnosis not present

## 2016-03-09 MED ORDER — MORPHINE SULFATE 15 MG PO TABS: 15.0000 mg | ORAL_TABLET | Freq: Four times a day (QID) | ORAL | Status: DC | PRN

## 2016-03-09 NOTE — Progress Notes (Signed)
Patient would like to discuss pain control. Made contact with hospice this past Tuesday.

## 2016-03-09 NOTE — Progress Notes (Signed)
[place] NOTE  Patient Care Team: Margo Common, PA as PCP - General (Physician Assistant) Clent Jacks, RN as Registered Nurse  CHIEF COMPLAINTS/PURPOSE OF CONSULTATION: pancreatic cancer  # FEB 2017- METASTATIC PANCREATIC CANCER- with liver metastasis [Liver Bx- US]Pancreatic neck mass invasion of splenic/portal vasculature; Liver masses- multiple ~ 3.2x2.4cm [largest]; Ca-19-9- 45,000  HISTORY OF PRESENTING ILLNESS:  Jared Paul 75 y.o.  male patient with recently  Diagnosed metastatic  Pancreatic cancer to the liver is here for follow-up.  He is accompanied by his wife.  Patient is on fentanyl patch 25 g;  Is taking morphine 15 mg maybe twice a day.  Pain is fairly well controlled. Appetite is poor.;  Losing weight.  Complaining of nausea-  However he has not been able to  Get a prescription for his nausea medication as yet.   ROS:  Otherwise denies any cough  Her shortness of breath. Denies any tingling and numbness in his feet. A complete 10 point review of system is done which is negative except mentioned above in history of present illness  MEDICAL HISTORY:  Past Medical History  Diagnosis Date  . Diabetes mellitus without complication ([facility])   . Hypertension   . Hyperlipidemia   . Skin cancer   . Pancreatic mass   . Arthritis     SURGICAL HISTORY: Past Surgical History  Procedure Laterality Date  . Hemorrhoid surgery    . Tonsillectomy and adenoidectomy    . Laminectomy    . Colonoscopy with propofol N/A 10/07/2015    Procedure: COLONOSCOPY WITH PROPOFOL;  Surgeon: Josefine Class, MD;  Location: [facility] ENDOSCOPY;  Service: Endoscopy;  Laterality: N/A;    SOCIAL HISTORY: Social History   Social History  . Marital Status: Married    Spouse Name: N/A  . Number of Children: N/A  . Years of Education: N/A   Occupational History  . Not on file.   Social History Main Topics  . Smoking status: Former Smoker -- 1.00 packs/day  for 5 years    Types: Cigarettes    Quit date: 01/01/1964  . Smokeless tobacco: Never Used  . Alcohol Use: No  . Drug Use: No  . Sexual Activity: Not on file   Other Topics Concern  . Not on file   Social History Narrative    FAMILY HISTORY: pancreatic ca/lung ca at 76y.  Family History  Problem Relation Age of Onset  . Diabetes Mother   . Hypertension Mother   . Stroke Mother   . Lung cancer Father 39  . Pancreatic cancer Father 66  . Diabetes Father   . Multiple sclerosis Brother   . Diabetes Brother   . Pneumonia Maternal Grandmother   . Pneumonia Maternal Grandfather   . Heart failure Paternal Grandmother   . Healthy Brother     ALLERGIES:  is allergic to prednisone.  MEDICATIONS:  Current Outpatient Prescriptions  Medication Sig Dispense Refill  . ACCU-CHEK SOFTCLIX LANCETS lancets Use 1 lancet daily 100 each 3  . aspirin 81 MG tablet Take 81 mg by mouth daily.    . chlorpheniramine (ALLERGY) 4 MG tablet Take 1 tablet by mouth daily. Reported on 02/28/2016    . Cinnamon 500 MG capsule Take 2,000 mg by mouth daily.    . ciprofloxacin (CIPRO) 500 MG tablet Take 1 tablet (500 mg total) by mouth 2 (two) times daily. 14 tablet 0  . doxazosin (CARDURA) 4 MG tablet Take 1 tablet by mouth  daily 90 tablet 3  . fentaNYL (DURAGESIC - DOSED MCG/HR) 25 MCG/HR patch Place 1 patch (25 mcg total) onto the skin every 3 (three) days. 10 patch 0  . GAVILYTE-N WITH FLAVOR PACK 420 G solution Reported on 02/28/2016    . glucose blood (ACCU-CHEK AVIVA PLUS) test strip Use as instructed and check blood sugar once a day 100 each 12  . Insulin Glargine (LANTUS SOLOSTAR) 100 UNIT/ML Solostar Pen Inject 14 Units into the skin daily at 10 pm. 5 pen 5  . meloxicam (MOBIC) 15 MG tablet Take 15 mg by mouth daily.    . metFORMIN (GLUCOPHAGE) 500 MG tablet Take 2 tablets (1,000 mg total) by mouth 2 (two) times daily with a meal. 60 tablet 6  . metroNIDAZOLE (FLAGYL) 500 MG tablet Take 1 tablet  (500 mg total) by mouth 2 (two) times daily. 14 tablet 0  . morphine (MSIR) 15 MG tablet Take 1 tablet (15 mg total) by mouth every 4 (four) hours as needed for severe pain. 30 tablet 0  . Omega-3 Fatty Acids (FISH OIL) 1000 MG CAPS Take 4 capsules by mouth.    . ONGLYZA 5 MG TABS tablet TAKE ONE TABLET BY MOUTH ONCE DAILY 90 tablet 3  . pioglitazone (ACTOS) 15 MG tablet TAKE ONE TABLET BY MOUTH ONCE DAILY 30 tablet 6  . prochlorperazine (COMPAZINE) 10 MG tablet Use 1 tablet every 6 to 8 hours for nausea 40 tablet 1  . simvastatin (ZOCOR) 20 MG tablet Take 1 tablet by mouth  every day 90 tablet 3   No current facility-administered medications for this visit.      Marland Kitchen  PHYSICAL EXAMINATION: ECOG PERFORMANCE STATUS: 0 - Asymptomatic  Filed Vitals:   03/09/16 1137  BP: 139/87  Pulse: 81  Temp: 97.8 F (36.6 C)  Resp: 18   Filed Weights   03/09/16 1137  Weight: 163 lb 5.8 oz (74.1 kg)    GENERAL: Well-nourished well-developed; Alert, no distress and comfortable.  Accompanied by his wife.   LABORATORY DATA:  I have reviewed the data as listed Lab Results  Component Value Date   WBC 4.3 02/28/2016   HGB 13.9 02/28/2016   HCT 40.8 02/28/2016   MCV 88.0 02/28/2016   PLT 108* 02/28/2016    Recent Labs  09/15/15 0814 02/14/16 1429 02/22/16 1100  NA 139 146* 136  K 4.4 4.5 3.8  CL 100 101 103  CO2 23 24 27   GLUCOSE 238* 142* 192*  BUN 16 24 26*  CREATININE 0.76 0.96 0.74  CALCIUM 9.4 10.6* 9.1  GFRNONAA 91 78 >60  GFRAA 105 90 >60  PROT 6.5 6.8 6.8  ALBUMIN 4.5 4.6 4.0  AST 16 37 50*  ALT 17 43 64*  ALKPHOS 38* 76 79  BILITOT 0.9 0.7 0.8    RADIOGRAPHIC STUDIES: I have personally reviewed the radiological images as listed and agreed with the findings in the report. Ct Abdomen Pelvis W Contrast  02/20/2016  CLINICAL DATA:  Left mid and left lower quadrant abdominal pain and nausea for the past 3 weeks. Previous back surgery. EXAM: CT ABDOMEN AND PELVIS WITH  CONTRAST TECHNIQUE: Multidetector CT imaging of the abdomen and pelvis was performed using the standard protocol following bolus administration of intravenous contrast. CONTRAST:  164mL OMNIPAQUE IOHEXOL 300 MG/ML  SOLN COMPARISON:  None. FINDINGS: Lower chest: 7 mm right posterior pleural lipoma on the first image. No lung nodules. Small bulla at the left lung base. Small inferior pericardial effusion  with a maximum thickness of 9 mm. Hepatobiliary: Multiple rounded and oval, poorly defined liver masses. The largest measures 3.8 x 2.7 cm on image number 14 of series 2. There is also a 1.3 cm oval mass with peripheral contrast puddling on the initial images and isodense to the remainder of the liver on the delayed images in the inferior aspect of the medial segment of the left lobe of the liver. Poorly distended gallbladder. Pancreas: On image number 22 of series 2 be, sagittal image number 68 and coronal image number 43, there is a small, subtle area of slightly lower density in the posterior aspect of the pancreatic neck and encasing the adjacent medial aspect of the splenic vein, causing severe narrowing of the lumen of the vein. This is also extending into the adjacent proximal portal vein. The pancreatic tail it is somewhat small relative to the remainder the pancreas with dilatation of the pancreatic duct in the tail. Spleen: Within normal limits in size and appearance. Adrenals/Urinary Tract: Calcifications in the superior aspects of both adrenal glands. There is also a left adrenal mass measuring 1.4 x 1.1 cm on image number 20 of series 2 and also well seen on coronal image number 54. Normal appearing kidneys and ureters. Mild diffuse bladder wall thickening. Stomach/Bowel: Multiple sigmoid and descending colon diverticula. Normal appearing appendix. No gastric or small bowel abnormalities seen. Vascular/Lymphatic: There is a gastrohepatic ligament lymph node on image number 19 of series 2 with a short axis  diameter of 10.5 mm. There is a nearby smaller, mildly enlarged gastrohepatic ligament lymph node more laterally. No other enlarged lymph nodes are seen. Reproductive: Moderately enlarged prostate gland. Other: Very small umbilical hernia containing fat. Musculoskeletal:  Lumbar spine degenerative changes. IMPRESSION: 1. Small, poorly defined mass in the inferior aspect of the pancreatic neck that is encasing the adjacent splenic vein, causing almost complete obstruction of the vein and extending into the adjacent portal vein. This is highly suspicious for a primary pancreatic neoplasm. This could be further evaluated with pre and postcontrast magnetic resonance imaging of the pancreas or endoscopic ultrasound. 2. Multiple liver metastases a single hemangioma. 3. 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 4. Moderately enlarged prostate gland. 5. Mild diffuse bladder wall thickening, compatible chronic bladder outlet obstruction by the enlarged prostate gland. 6. 1.4 cm left adrenal probable adenoma. A metastasis is less likely. 7. Colonic diverticulosis. 8. Calcifications in the superior aspects of both adrenal glands, compatible with previous hemorrhage. 9. Small inferior pericardial effusion. These results will be called to the ordering clinician or representative by the Radiologist Assistant, and communication documented in the PACS or zVision Dashboard. Electronically Signed   By: Claudie Revering M.D.   On: 02/20/2016 15:41   Nm Pet Image Initial (pi) Skull Base To Thigh  02/27/2016  CLINICAL DATA:  Initial treatment strategy for pancreatic head mass. EXAM: NUCLEAR MEDICINE PET SKULL BASE TO THIGH TECHNIQUE: 12.2 mCi F-18 FDG was injected intravenously. Full-ring PET imaging was performed from the skull base to thigh after the radiotracer. CT data was obtained and used for attenuation correction and anatomic localization. FASTING BLOOD GLUCOSE:  Value: 77 mg/dl COMPARISON:  CT scan from 02/20/2016  FINDINGS: NECK No hypermetabolic lymph nodes in the neck. CHEST No hypermetabolic mediastinal or hilar nodes. No suspicious pulmonary nodules on the CT scan. ABDOMEN/PELVIS As seen on the recent CT scan, there are multiple poorly defined lesions in the liver. Index 3.1 cm lesion towards the dome is hypermetabolic with SUV max =  9.6. 2.3 cm posterior right hepatic lesion (Segment VI) has SUV max = 7.4. The area of abnormal pancreas seen on the recent CT scan shows only mild hypermetabolism with SUV max = 3.1 in the region of the lesion causing the splenic vein attenuation. No evidence for hypermetabolic lymphadenopathy in the abdomen or pelvis. Scattered areas of FDG accumulation are identified in the colon, but these are distributed along the length of the colon and have imaging features most suggestive of physiologic uptake. Diffuse diverticular changes seen in the left colon without features of diverticulitis. SKELETON No focal hypermetabolic activity to suggest skeletal metastasis. IMPRESSION: 1. The lesion in the pancreatic neck is mildly hypermetabolic. 2. Multiple hepatic lesions are hypermetabolic consistent with metastatic disease. 3. No evidence for hypermetabolic metastases in the chest or pelvis. Electronically Signed   By: Misty Stanley M.D.   On: 02/27/2016 10:02   US Biopsy  02/28/2016  INDICATION: Right hepatic lesion. EXAM: ULTRASOUND BIOPSY CORE LIVER MEDICATIONS: None. ANESTHESIA/SEDATION: Moderate (conscious) sedation was employed during this procedure. A total of Versed 2.0 mg and Fentanyl 75 mcg was administered intravenously. Moderate Sedation Time: 23 minutes. The patient's level of consciousness and vital signs were monitored continuously by radiology nursing throughout the procedure under my direct supervision. COMPLICATIONS: None immediate. PROCEDURE: Informed written consent was obtained from the patient after a thorough discussion of the procedural risks, benefits and alternatives. All  questions were addressed. Maximal Sterile Barrier Technique was utilized including sterile gloves, sterile drape, hand hygiene and skin antiseptic. A timeout was performed prior to the initiation of the procedure. Under real-time ultrasound guidance, 17 gauge guiding needle was directed into right hepatic lesion. Two core samples were obtained using 18 gauge biopsy needle. These were placed in sample vial. This was technically difficult due to the depth of the lesion. Guiding needle was accidentally withdrawn prior to Gel-Foam administration. Appropriate dressing was applied. No immediate complications were noted. IMPRESSION: Under real-time ultrasound guidance, core biopsy was performed of right hepatic lesion. This was technically difficult due to depth of the lesion, and if samples demonstrate normal hepatic parenchyma, repeat biopsy under CT guidance is recommended. Electronically Signed   By: Marijo Conception, M.D.   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  After lengthy discussions-  Patient declined palliative chemotherapy.  He decide to go on hospice.  #  Pain control-  Continue fentanyl 25 g;  Patient has been taking morphine  15 mg  Twice a day.  Given new prescription for morphine 15 mg q 6 hrs prn.   #  We'll send out a hospice referral.   #  I also spoke to patient's PCP- [name] Chrismon-  Regarding the above plan.  He agrees.  I offered to talk to  Patient's daughter/  Patient declined.  No follow-up is made.  # 15 minutes face-to-face with the patient discussing the above plan of care; more than 50% of time spent on prognosis/ natural history; counseling and coordination.      Cammie Sickle, MD 03/09/2016 12:04 PM

## 2016-03-11 ENCOUNTER — Encounter: Payer: Self-pay | Admitting: *Deleted

## 2016-03-11 NOTE — Progress Notes (Unsigned)
On Friday 3/10 pt was seen by Dr. Rogue Bussing and pt chose hospice services and referral made to hospice on 03/09/16. Patient and wife had already stopped by there to inquire about their services before coming to the md appt. All info faxed to hospice.

## 2016-03-14 ENCOUNTER — Telehealth: Payer: Self-pay | Admitting: *Deleted

## 2016-03-14 MED ORDER — FENTANYL 12 MCG/HR TD PT72: 12.5000 ug | MEDICATED_PATCH | TRANSDERMAL | Status: DC

## 2016-03-14 NOTE — Telephone Encounter (Signed)
Increased patch to 37.69mcg Rx faxed. Hospice notified

## 2016-03-14 NOTE — Telephone Encounter (Addendum)
Asking if fentanyl patch can be increased because he is having to use MSIR frequently to control his pain. He is currently on 25 mcg

## 2016-03-21 ENCOUNTER — Telehealth: Payer: Self-pay | Admitting: *Deleted

## 2016-03-21 DIAGNOSIS — C259 Malignant neoplasm of pancreas, unspecified: Secondary | ICD-10-CM

## 2016-03-21 MED ORDER — FENTANYL 50 MCG/HR TD PT72: MEDICATED_PATCH | TRANSDERMAL | Status: DC

## 2016-03-21 MED ORDER — MORPHINE SULFATE (CONCENTRATE) 20 MG/ML PO SOLN: ORAL | Status: AC

## 2016-03-21 MED ORDER — MORPHINE SULFATE 15 MG PO TABS: ORAL_TABLET | ORAL | Status: AC

## 2016-03-21 NOTE — Telephone Encounter (Signed)
Got a call from [place] at hospice with pain not under control. Had to call Dr. Eustaquio Maize at hospice about his pain because it was too early  And the office not open yet and she said to put 12 mcg to equal 50 patch and cont. The morphine IR.  Jocelyn Lamer states she needs 50 mcg patch, and ms IR refill and pt had told her that md said it could be every 3 hours if needed and per Dr. B that is ok 1 tablet every 3-4 hours as needed.  Also Jocelyn Lamer asked for morphine liq for the comfort kit because comfort kit has not been delivered and she needs something to offer relief when staff not available.  Dr. B was agreeable and all 3 meds sent to [address] [facility]

## 2016-03-26 ENCOUNTER — Telehealth: Payer: Self-pay | Admitting: *Deleted

## 2016-03-26 ENCOUNTER — Telehealth: Payer: Self-pay

## 2016-03-26 ENCOUNTER — Ambulatory Visit (INDEPENDENT_AMBULATORY_CARE_PROVIDER_SITE_OTHER): Payer: Medicare Other | Admitting: Family Medicine

## 2016-03-26 ENCOUNTER — Encounter: Payer: Self-pay | Admitting: Family Medicine

## 2016-03-26 ENCOUNTER — Ambulatory Visit
Admission: RE | Admit: 2016-03-26 | Discharge: 2016-03-26 | Disposition: A | Discharge status: Home / Self Care | Source: Ambulatory Visit | Attending: Family Medicine | Admitting: Family Medicine

## 2016-03-26 VITALS — BP 136/76 | HR 84 | Temp 98.9°F | Resp 16 | Wt 155.2 lb

## 2016-03-26 DIAGNOSIS — M5134 Other intervertebral disc degeneration, thoracic region: Secondary | ICD-10-CM | POA: Diagnosis not present

## 2016-03-26 DIAGNOSIS — E119 Type 2 diabetes mellitus without complications: Secondary | ICD-10-CM

## 2016-03-26 DIAGNOSIS — C787 Secondary malignant neoplasm of liver and intrahepatic bile duct: Secondary | ICD-10-CM | POA: Diagnosis not present

## 2016-03-26 DIAGNOSIS — M546 Pain in thoracic spine: Secondary | ICD-10-CM

## 2016-03-26 DIAGNOSIS — C259 Malignant neoplasm of pancreas, unspecified: Secondary | ICD-10-CM

## 2016-03-26 LAB — POCT GLYCOSYLATED HEMOGLOBIN (HGB A1C): Hemoglobin A1C: 6.8

## 2016-03-26 NOTE — Telephone Encounter (Signed)
Patient's wife Hassan Rowan (on consent) advised as directed below. Hassan Rowan verbalized understanding.

## 2016-03-26 NOTE — Telephone Encounter (Signed)
-----   Message from Margo Common, [place] sent at 03/26/2016  3:38 PM EDT ----- Hgb A1C in good shape at 6.8. Proceed with Lantus insulin and titration of dosage by averaging FBS every 3 days to see if adjustments needed. Recheck in 2 months. X-ray of spine is negative for any fracture or bony metastasis of pancreatic cancer. Suspect injury is a deep contusion. Recheck back prn.

## 2016-03-26 NOTE — Patient Instructions (Signed)
Fall Prevention in the Home  Falls can cause injuries and can affect people from all age groups. There are many simple things that you can do to make your home safe and to help prevent falls. WHAT CAN I DO ON THE OUTSIDE OF MY HOME?  Regularly repair the edges of walkways and driveways and fix any cracks.  Remove high doorway thresholds.  Trim any shrubbery on the main path into your home.  Use bright outdoor lighting.  Clear walkways of debris and clutter, including tools and rocks.  Regularly check that handrails are securely fastened and in good repair. Both sides of any steps should have handrails.  Install guardrails along the edges of any raised decks or porches.  Have leaves, snow, and ice cleared regularly.  Use sand or salt on walkways during winter months.  In the garage, clean up any spills right away, including grease or oil spills. WHAT CAN I DO IN THE BATHROOM?  Use night lights.  Install grab bars by the toilet and in the tub and shower. Do not use towel bars as grab bars.  Use non-skid mats or decals on the floor of the tub or shower.  If you need to sit down while you are in the shower, use a plastic, non-slip stool..  Keep the floor dry. Immediately clean up any water that spills on the floor.  Remove soap buildup in the tub or shower on a regular basis.  Attach bath mats securely with double-sided non-slip rug tape.  Remove throw rugs and other tripping hazards from the floor. WHAT CAN I DO IN THE BEDROOM?  Use night lights.  Make sure that a bedside light is easy to reach.  Do not use oversized bedding that drapes onto the floor.  Have a firm chair that has side arms to use for getting dressed.  Remove throw rugs and other tripping hazards from the floor. WHAT CAN I DO IN THE KITCHEN?   Clean up any spills right away.  Avoid walking on wet floors.  Place frequently used items in easy-to-reach places.  If you need to reach for something  above you, use a sturdy step stool that has a grab bar.  Keep electrical cables out of the way.  Do not use floor polish or wax that makes floors slippery. If you have to use wax, make sure that it is non-skid floor wax.  Remove throw rugs and other tripping hazards from the floor. WHAT CAN I DO IN THE STAIRWAYS?  Do not leave any items on the stairs.  Make sure that there are handrails on both sides of the stairs. Fix handrails that are broken or loose. Make sure that handrails are as long as the stairways.  Check any carpeting to make sure that it is firmly attached to the stairs. Fix any carpet that is loose or worn.  Avoid having throw rugs at the top or bottom of stairways, or secure the rugs with carpet tape to prevent them from moving.  Make sure that you have a light switch at the top of the stairs and the bottom of the stairs. If you do not have them, have them installed. WHAT ARE SOME OTHER FALL PREVENTION TIPS?  Wear closed-toe shoes that fit well and support your feet. Wear shoes that have rubber soles or low heels.  When you use a stepladder, make sure that it is completely opened and that the sides are firmly locked. Have someone hold the ladder while you   are using it. Do not climb a closed stepladder.  Add color or contrast paint or tape to grab bars and handrails in your home. Place contrasting color strips on the first and last steps.  Use mobility aids as needed, such as canes, walkers, scooters, and crutches.  Turn on lights if it is dark. Replace any light bulbs that burn out.  Set up furniture so that there are clear paths. Keep the furniture in the same spot.  Fix any uneven floor surfaces.  Choose a carpet design that does not hide the edge of steps of a stairway.  Be aware of any and all pets.  Review your medicines with your healthcare provider. Some medicines can cause dizziness or changes in blood pressure, which increase your risk of falling. Talk  with your health care provider about other ways that you can decrease your risk of falls. This may include working with a physical therapist or trainer to improve your strength, balance, and endurance.   This information is not intended to replace advice given to you by your health care provider. Make sure you discuss any questions you have with your health care provider.   Document Released: 12/07/2002 Document Revised: 05/03/2015 Document Reviewed: 01/21/2015 Elsevier Interactive Patient Education 2016 [employer]  

## 2016-03-26 NOTE — Progress Notes (Signed)
Patient ID: Jared Paul, male   DOB: 12-20-1941, 75 y.o.   MRN: [id number]   Patient: Jared Paul Male    DOB: 01/08/1941   75 y.o.   MRN: [id number] Visit Date: 03/26/2016  Today's Provider: Vernie Murders, PA   Chief Complaint  Patient presents with  . Hyperlipidemia  . Hypertension  . Diabetes  . Follow-up   Subjective:    Back Pain This is a new problem. The current episode started yesterday (when he slid down in the bathtub). The problem occurs constantly. The problem has been gradually improving since onset. The pain is present in the thoracic spine. The quality of the pain is described as stabbing. The pain does not radiate. The pain is at a severity of 8/10. Pain severity now: sharp and associated with movement or flexion. The symptoms are aggravated by bending, position, sitting and twisting. Pertinent negatives include no bladder incontinence, bowel incontinence, chest pain, leg pain, numbness or paresthesias. Risk factors include history of cancer (has pancreatic cancer with liver metastasis). He has tried analgesics (continues to use Fentanyl patches sustained release morphine and Roxanol prn acute pain - followed by Hospice at home) for the symptoms.    Diabetes Mellitus Type II, Follow-up:   Lab Results  Component Value Date   HGBA1C 7.7* 09/15/2015   Last seen for diabetes 3 months ago.  Management since then includes none. He reports good compliance with treatment. He is not having side effects.  Current symptoms include none and have been stable. Home blood sugar records: low 100's   Weight trend: decreasing steadily Current diet: well balanced Current exercise: none  ------------------------------------------------------------------------   Hypertension, follow-up:  BP Readings from Last 3 Encounters:  03/26/16 136/76  03/09/16 139/87  03/01/16 151/81    He was last seen for hypertension 3 months ago.  BP at that visit was 128/84. Management  since that visit includes none .He reports good compliance with treatment. He is not having side effects.  He is not exercising. He is adherent to low salt diet.   Outside blood pressures are being checked. He is experiencing none.  Patient denies none.   Cardiovascular risk factors include advanced age (older than 69 for men, 23 for women), diabetes mellitus, hypertension and male gender.  Use of agents associated with hypertension: none.   ------------------------------------------------------------------------    Lipid/Cholesterol, Follow-up:   Last seen for this 3 months ago.  Management since that visit includes Hospice care discontinued Simvastatin 20 mg.  Last Lipid Panel:    Component Value Date/Time   CHOL 129 09/15/2015 0814   TRIG 110 09/15/2015 0814   HDL 51 09/15/2015 0814   CHOLHDL 2.5 09/15/2015 0814   LDLCALC 56 09/15/2015 0814    He reports good compliance with treatment. He is not having side effects.   Wt Readings from Last 3 Encounters:  03/26/16 155 lb 3.2 oz (70.398 kg)  03/09/16 163 lb 5.8 oz (74.1 kg)  03/01/16 167 lb 8.8 oz (76 kg)    ------------------------------------------------------------------------   Current Outpatient Prescriptions on File Prior to Visit  Medication Sig Dispense Refill  . ACCU-CHEK SOFTCLIX LANCETS lancets Use 1 lancet daily 100 each 3  . chlorpheniramine (ALLERGY) 4 MG tablet Take 1 tablet by mouth daily. Reported on 02/28/2016    . Cinnamon 500 MG capsule Take 2,000 mg by mouth daily.    . fentaNYL (DURAGESIC - DOSED MCG/HR) 50 MCG/HR HOSPICE PATIENT 5 patch 0  . GAVILYTE-N WITH FLAVOR PACK 420  G solution Reported on 02/28/2016    . glucose blood (ACCU-CHEK AVIVA PLUS) test strip Use as instructed and check blood sugar once a day 100 each 12  . Insulin Glargine (LANTUS SOLOSTAR) 100 UNIT/ML Solostar Pen Inject 14 Units into the skin daily at 10 pm. 5 pen 5  . metFORMIN (GLUCOPHAGE) 500 MG tablet Take 2 tablets (1,000  mg total) by mouth 2 (two) times daily with a meal. 60 tablet 6  . morphine (MSIR) 15 MG tablet 1 tablet every 3-4 hours as needed for pain-HOSPICE PATIENT 120 tablet 0  . morphine (ROXANOL) 20 MG/ML concentrated solution Can take 10-20 mg every 3 hours as needed for pain-HOSPICE PATIENT-   To be used for comfort kit in case of uncontrolled pain 30 mL 0  . prochlorperazine (COMPAZINE) 10 MG tablet Use 1 tablet every 6 to 8 hours for nausea 40 tablet 1   No current facility-administered medications on file prior to visit.     Allergies  Allergen Reactions  . Prednisone     ELEVATED BLOOD SUGAR    Review of Systems  Constitutional: Negative.   HENT: Negative.   Eyes: Negative.   Respiratory: Negative.   Cardiovascular: Negative.  Negative for chest pain.  Gastrointestinal: Negative.  Negative for bowel incontinence.  Endocrine: Negative.   Genitourinary: Negative.  Negative for bladder incontinence.  Musculoskeletal: Positive for back pain.  Skin: Negative.   Allergic/Immunologic: Negative.   Neurological: Negative.  Negative for numbness and paresthesias.  Hematological: Negative.   Psychiatric/Behavioral: Negative.     Social History  Substance Use Topics  . Smoking status: Former Smoker -- 1.00 packs/day for 5 years    Types: Cigarettes    Quit date: 01/01/1964  . Smokeless tobacco: Never Used  . Alcohol Use: No   Objective:   BP 136/76 mmHg  Pulse 84  Temp(Src) 98.9 F (37.2 C) (Oral)  Resp 16  Wt 155 lb 3.2 oz (70.398 kg)  Physical Exam  Constitutional: He is oriented to person, place, and time. He appears well-developed.  Thin and pale  HENT:  Head: Normocephalic.  Eyes: Conjunctivae and EOM are normal.  Neck:  Unchanged stiffness and loss of ROM.  Cardiovascular: Normal rate and regular rhythm.   Pulmonary/Chest: Effort normal and breath sounds normal.  Abdominal: Soft. Bowel sounds are normal.  Musculoskeletal: He exhibits tenderness.  Lower thoracic  spine pain to palpate or move. No radiation of pain or numbness in extremities. Severe pain to flex or rotate torso.  Neurological: He is alert and oriented to person, place, and time. He has normal reflexes.  Skin: No rash noted.      Assessment & Plan:     1. Thoracic spine pain Onset yesterday when he slid down in the bathtub. Having sharp pains with movement of torso or trying to bend over. Will continue morphine and fentanyl patches for pain. Will get x-ray of thoracic spine to rule out metastasis to bone or acute fracture/compression of vertebrae. Recheck pending reports. - DG Thoracic Spine W/Swimmers  2. Type 2 diabetes mellitus without complication, unspecified long term insulin use status (HCC) Blood sugars have been low and Hospice is requesting we discontinue the Onglyza. Has continued Lantus at 18 units daily with Metformin 500 mg 2 BID. Actos was stopped during treatment initiation on pancreatic cancer. Hgb A1C was 6.8 % today. May continue to average FBS every 3 days to adjust Lantus up 1 unit if glucose average is over 150 and down by  1 unit if glucose average is below 100. - POCT glycosylated hemoglobin (Hb A1C)  3. Pancreatic cancer metastasized to liver [facility]) Diagnosed on CT scan on 02-20-16 and confirmed by biopsy on 02-28-16. Simvastatin was discontinued due to liver metastasis. No longer taking Meloxicam for arthritic pain since he is using MSIR 15 mg q 3-4 hours prn pain, Roxanol 15m/ml q 3 hours prn uncontrolled pain and Fentanyl 50 mcg/hr patch qd. Uses Compazine 10 mg q 6-8 hours prn nausea. Decided to stop the Cardura and using Gavilyte-N for constipation. Continues follow up with Dr. BRogue Bussing(oncologist) through Hospice home care. Patient has decided against any aggressive therapies and wants to remain at home.

## 2016-03-26 NOTE — Telephone Encounter (Signed)
Fell in tub yesterday when getting out and his feet slipped out from under him. He hit his head and back. Went to see PCP today because he already had an appt and xray showed a deep contusion. Since getting home from PCP office he has lost 25% of vision bilaterally left side of both eyes. He is still having uncontrolled pain and asking if we want to go up on his fentanyl  To 75 mcg. They spoke with hospice md who suggested he be put on flexeril 10 tid and lidoderm patches which they called in for him

## 2016-03-27 ENCOUNTER — Telehealth: Payer: Self-pay | Admitting: Internal Medicine

## 2016-03-27 ENCOUNTER — Telehealth: Payer: Self-pay | Admitting: Family Medicine

## 2016-03-27 MED ORDER — FENTANYL 75 MCG/HR TD PT72: 75.0000 ug | MEDICATED_PATCH | TRANSDERMAL | Status: DC

## 2016-03-27 NOTE — Telephone Encounter (Signed)
Pt's Jared Paul wife called and request for [name] to call pt. Jared Paul stated that pt would like to get [name]' advise on a question he has. Jared Paul wouldn't go into more detail. Thanks TNP

## 2016-03-27 NOTE — Telephone Encounter (Signed)
Per VO Dr. Rogue Bussing will increase Fentanyl to 75 mcg.  Faxed new prescription to pharmacy.

## 2016-03-27 NOTE — Telephone Encounter (Signed)
She left voicemail asking for Dr. Rogue Bussing to please call: [phone]. Thanks.

## 2016-03-27 NOTE — Telephone Encounter (Signed)
Could not get in touch with his oncologist and noticing some central vision loss in the left eye. Denies significant headache, dizziness, extremity weakness or numbness. Advised he may need CT scan to check for intracranial bleed from recent fall or a retinal tear. He did not want to go through the expense or lay down on the hard table with back soreness from fall in his bathtub recently. He has pancreatic cancer with liver metastasis and may have brain lesion or tendency to bleed. Wants to have Hospice nurse check BP and will have her send [place] a report.

## 2016-03-28 ENCOUNTER — Emergency Department
Admission: EM | Admit: 2016-03-28 | Discharge: 2016-03-28 | Disposition: A | Discharge status: Home / Self Care | Attending: Emergency Medicine | Admitting: Emergency Medicine

## 2016-03-28 ENCOUNTER — Encounter: Payer: Self-pay | Admitting: Emergency Medicine

## 2016-03-28 ENCOUNTER — Emergency Department

## 2016-03-28 DIAGNOSIS — Y929 Unspecified place or not applicable: Secondary | ICD-10-CM | POA: Insufficient documentation

## 2016-03-28 DIAGNOSIS — Z87891 Personal history of nicotine dependence: Secondary | ICD-10-CM | POA: Diagnosis not present

## 2016-03-28 DIAGNOSIS — I10 Essential (primary) hypertension: Secondary | ICD-10-CM | POA: Diagnosis not present

## 2016-03-28 DIAGNOSIS — K8689 Other specified diseases of pancreas: Secondary | ICD-10-CM | POA: Diagnosis not present

## 2016-03-28 DIAGNOSIS — Y999 Unspecified external cause status: Secondary | ICD-10-CM | POA: Diagnosis not present

## 2016-03-28 DIAGNOSIS — M1990 Unspecified osteoarthritis, unspecified site: Secondary | ICD-10-CM | POA: Diagnosis not present

## 2016-03-28 DIAGNOSIS — H538 Other visual disturbances: Secondary | ICD-10-CM | POA: Diagnosis not present

## 2016-03-28 DIAGNOSIS — Z7984 Long term (current) use of oral hypoglycemic drugs: Secondary | ICD-10-CM | POA: Insufficient documentation

## 2016-03-28 DIAGNOSIS — H539 Unspecified visual disturbance: Secondary | ICD-10-CM

## 2016-03-28 DIAGNOSIS — Z85828 Personal history of other malignant neoplasm of skin: Secondary | ICD-10-CM | POA: Diagnosis not present

## 2016-03-28 DIAGNOSIS — R410 Disorientation, unspecified: Secondary | ICD-10-CM | POA: Diagnosis not present

## 2016-03-28 DIAGNOSIS — Z79899 Other long term (current) drug therapy: Secondary | ICD-10-CM | POA: Insufficient documentation

## 2016-03-28 DIAGNOSIS — M4806 Spinal stenosis, lumbar region: Secondary | ICD-10-CM | POA: Insufficient documentation

## 2016-03-28 DIAGNOSIS — Y939 Activity, unspecified: Secondary | ICD-10-CM | POA: Insufficient documentation

## 2016-03-28 DIAGNOSIS — N400 Enlarged prostate without lower urinary tract symptoms: Secondary | ICD-10-CM | POA: Diagnosis not present

## 2016-03-28 DIAGNOSIS — Z794 Long term (current) use of insulin: Secondary | ICD-10-CM | POA: Insufficient documentation

## 2016-03-28 DIAGNOSIS — W01198A Fall on same level from slipping, tripping and stumbling with subsequent striking against other object, initial encounter: Secondary | ICD-10-CM | POA: Diagnosis not present

## 2016-03-28 DIAGNOSIS — E785 Hyperlipidemia, unspecified: Secondary | ICD-10-CM | POA: Insufficient documentation

## 2016-03-28 DIAGNOSIS — E119 Type 2 diabetes mellitus without complications: Secondary | ICD-10-CM | POA: Insufficient documentation

## 2016-03-28 DIAGNOSIS — M4692 Unspecified inflammatory spondylopathy, cervical region: Secondary | ICD-10-CM | POA: Insufficient documentation

## 2016-03-28 DIAGNOSIS — W19XXXA Unspecified fall, initial encounter: Secondary | ICD-10-CM

## 2016-03-28 NOTE — ED Provider Notes (Signed)
[facility] Emergency Department Provider Note   ____________________________________________  Time seen: ~1510  I have reviewed the triage vital signs and the nursing notes.   HISTORY  Chief Complaint Fall   History limited by: Not Limited   HPI Jared Paul is a 75 y.o. male who presents to the emergency department today because of concerns for vision change after a fall. He states that he fell 4 days ago. 2 days ago he started noticing some decreased peripheral vision of his left eye. Feels his right eye is not involved. He denies any change to his central vision. He denies any pain in his eyes. Denies similar symptoms in the past. Denies any eye problems in the past. Denies any nausea or vomiting. Denies any other concerning symptoms.   Past Medical History  Diagnosis Date  . Diabetes mellitus without complication ([place])   . Hypertension   . Hyperlipidemia   . Skin cancer   . Pancreatic mass   . Arthritis     Patient Active Problem List   Diagnosis Date Noted  . HLD (hyperlipidemia) 02/14/2016  . Diverticulosis of sigmoid colon 02/14/2016  . Type 2 diabetes mellitus ([place]) 12/19/2015  . Apnea, sleep 10/12/2015  . 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 Nocturia 09/12/2015  . Hemorrhoids, internal 09/12/2015  . Spinal stenosis, lumbar 09/12/2015  . Hyperlipidemia 09/12/2015  . Elevated blood pressure 09/12/2015  . Type 2 diabetes mellitus without complication ([place]) 80/99/8338  . Cervical arthritis ([place]) 09/12/2015  . Benign prostate hyperplasia 09/12/2015  . 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 Hemorrhoid surgery    . Tonsillectomy and adenoidectomy    . Laminectomy    . Colonoscopy with propofol N/A 10/07/2015    Procedure: COLONOSCOPY WITH PROPOFOL;  Surgeon: Josefine Class, MD;  Location[facility] ENDOSCOPY;  Service: Endoscopy;   Laterality: N/A;    Current Outpatient Rx  Name  Route  Sig  Dispense  Refill  . ACCU-CHEK SOFTCLIX LANCETS lancets      Use 1 lancet daily   100 each   3   . chlorpheniramine (ALLERGY) 4 MG tablet   Oral   Take 1 tablet by mouth daily. Reported on 02/28/2016         . Cinnamon 500 MG capsule   Oral   Take 2,000 mg by mouth daily.         . fentaNYL (DURAGESIC - DOSED MCG/HR) 75 MCG/HR   Transdermal   Place 1 patch (75 mcg total) onto the skin every 3 (three) days. HOSPICE PATIENT   5 patch   0   . GAVILYTE-N WITH FLAVOR PACK 420 G solution      Reported on 02/28/2016           Dispense as written.   Marland Kitchen glucose blood (ACCU-CHEK AVIVA PLUS) test strip      Use as instructed and check blood sugar once a day   100 each   12   . Insulin Glargine (LANTUS SOLOSTAR) 100 UNIT/ML Solostar Pen   Subcutaneous   Inject 14 Units into the skin daily at 10 pm.   5 pen   5   . metFORMIN (GLUCOPHAGE) 500 MG tablet   Oral   Take 2 tablets (1,000 mg total) by mouth 2 (two) times daily with a meal.   60 tablet   6   . morphine (MSIR)  15 MG tablet      1 tablet every 3-4 hours as needed for pain-HOSPICE PATIENT   120 tablet   0   . morphine (ROXANOL) 20 MG/ML concentrated solution      Can take 10-20 mg every 3 hours as needed for pain-HOSPICE PATIENT-   To be used for comfort kit in case of uncontrolled pain   30 mL   0   . prochlorperazine (COMPAZINE) 10 MG tablet      Use 1 tablet every 6 to 8 hours for nausea   40 tablet   1     Allergies Prednisone  Family History  Problem Relation Age of Onset  . Diabetes Mother   . Hypertension Mother   . Stroke Mother   . Lung cancer Father 10  . Pancreatic cancer Father 56  . Diabetes Father   . Multiple sclerosis Brother   . Diabetes Brother   . Pneumonia Maternal Grandmother   . Pneumonia Maternal Grandfather   . Heart failure Paternal Grandmother   . Healthy Brother     Social History Social History   Substance Use Topics  . Smoking status: Former Smoker -- 1.00 packs/day for 5 years    Types: Cigarettes    Quit date: 01/01/1964  . Smokeless tobacco: Never Used  . Alcohol Use: No    Review of Systems  Constitutional: Negative for fever. Cardiovascular: Negative for chest pain. Respiratory: Negative for shortness of breath. Gastrointestinal: Negative for abdominal pain, vomiting and diarrhea. Genitourinary: Negative for dysuria. Musculoskeletal: Negative for back pain. Skin: Negative for rash. Neurological: Negative for headaches, focal weakness or numbness. Positive for left peripheral vision loss.   10-point ROS otherwise negative.  ____________________________________________   PHYSICAL EXAM:  VITAL SIGNS: ED Triage Vitals  Enc Vitals Group     BP 03/28/16 1414 132/68 mmHg     Pulse Rate 03/28/16 1414 92     Resp 03/28/16 1414 16     Temp 03/28/16 1414 98 F (36.7 C)     Temp Source 03/28/16 1414 Oral     SpO2 03/28/16 1414 97 %     Weight 03/28/16 1414 150 lb (68.04 kg)     Height 03/28/16 1414 '5\' 10"'$  (1.778 m)   Constitutional: Alert and oriented. Well appearing and in no distress. Eyes: Conjunctivae are normal. PERRL. Normal extraocular movements. ENT   Head: Normocephalic and atraumatic.   Nose: No congestion/rhinnorhea.   Mouth/Throat: Mucous membranes are moist.   Neck: No stridor. Hematological/Lymphatic/Immunilogical: No cervical lymphadenopathy. Cardiovascular: Normal rate, regular rhythm.  No murmurs, rubs, or gallops. Respiratory: Normal respiratory effort without tachypnea nor retractions. Breath sounds are clear and equal bilaterally. No wheezes/rales/rhonchi. Gastrointestinal: Soft and nontender. No distention.  Genitourinary: Deferred Musculoskeletal: Normal range of motion in all extremities. No joint effusions.  No lower extremity tenderness nor edema. Neurologic:  Normal speech and language. No gross focal neurologic deficits  are appreciated. PERRL. EOMI. Decreased field of vision of left periphery. Funduscopic exam without any gross deficits.  Skin:  Skin is warm, dry and intact. No rash noted. Psychiatric: Mood and affect are normal. Speech and behavior are normal. Patient exhibits appropriate insight and judgment.  ____________________________________________    LABS (pertinent positives/negatives)  Labs Reviewed - No data to display   ____________________________________________   EKG  None  ____________________________________________    RADIOLOGY  CT head  IMPRESSION: No intracranial hemorrhage, mass effect or midline shift. Mild cerebral atrophy. Mild periventricular white matter decreased attenuation probable due  to chronic small vessel ischemic changes. No skull fracture. No definite acute cortical infarction. Atherosclerotic calcifications of carotid siphon.  ____________________________________________   PROCEDURES  Procedure(s) performed: None  Critical Care performed: No  ____________________________________________   INITIAL IMPRESSION / ASSESSMENT AND PLAN / ED COURSE  Pertinent labs & imaging results that were available during my care of the patient were reviewed by me and considered in my medical decision making (see chart for details).  Patient presented to the emergency department today because of concerns for fall and vision change. CT head negative for any bleed or subdural hematoma. No obvious abnormalities on funduscopic exam. Patient did have some decrease of left peripheral vision of the left eye. This point and no signs of bleeding from the fall. I do question if there is a small retinal or vitreous detachment. No signs to point to glaucoma. Patient's vision 20/50 in both eyes. Will give patient ophthalmology follow-up.  ____________________________________________   FINAL CLINICAL IMPRESSION(S) / ED DIAGNOSES  Final diagnoses:  Fall, initial encounter   Vision changes     Nance Pear, MD 03/28/16 1554

## 2016-03-28 NOTE — Telephone Encounter (Signed)
Hassan Rowan called stating Hospice came this morning and did some readings on pt.  Hospice nurse has sent these reading to our office.  CB#[phone] or [phone]

## 2016-03-28 NOTE — Discharge Instructions (Signed)
Please seek medical attention for any high fevers, chest pain, shortness of breath, change in behavior, persistent vomiting, bloody stool or any other new or concerning symptoms.   Visual Disturbances You have had a disturbance in your vision. This may be caused by various conditions, such as:  Migraines. Migraine headaches are often preceded by a disturbance in vision. Blind spots or light flashes are followed by a headache. This type of visual disturbance is temporary. It does not damage the eye.  Glaucoma. This is caused by increased pressure in the eye. Symptoms include haziness, blurred vision, or seeing rainbow colored circles when looking at bright lights. Partial or complete visual loss can occur. You may or may not experience eye pain. Visual loss may be gradual or sudden and is irreversible. Glaucoma is the leading cause of blindness.  Retina problems. Vision will be reduced if the retina becomes detached or if there is a circulation problem as with diabetes, high blood pressure, or a mini-stroke. Symptoms include seeing "floaters," flashes of light, or shadows, as if a curtain has fallen over your eye.  Optic nerve problems. The main nerve in your eye can be damaged by redness, soreness, and swelling (inflammation), poor circulation, drugs, and toxins. It is very important to have a complete exam done by a specialist to determine the exact cause of your eye problem. The specialist may recommend medicines or surgery, depending on the cause of the problem. This can help prevent further loss of vision or reduce the risk of having a stroke. Contact the caregiver to whom you have been referred and arrange for follow-up care right away. SEEK IMMEDIATE MEDICAL CARE IF:   Your vision gets worse.  You develop severe headaches.  You have any weakness or numbness in the face, arms, or legs.  You have any trouble speaking or walking.   This information is not intended to replace advice given  to you by your health care provider. Make sure you discuss any questions you have with your health care provider.   Document Released: 01/24/2005 Document Revised: 03/10/2012 Document Reviewed: 05/26/2014 Elsevier Interactive Patient Education [employer].

## 2016-03-28 NOTE — Telephone Encounter (Signed)
Spoke to the patient- recommend follow-up with ophthalmology. Recommend baby aspirin once a day.

## 2016-03-28 NOTE — ED Notes (Signed)
C/O loss of vision to left eye left fields.  On exam right eye all fields intact, left eye, left upper and left lower fields patient states loss of vision to left upper and left lower peripheral fields.  Patient states he noticed this vision change on Monday 3/27)

## 2016-03-28 NOTE — ED Notes (Signed)
Pt here from home; pt fell in bathtub and hit back of head on Sunday, pt reports vision and some confusion. Pt alert in room, able to answer questions appropriately. Pt reports he slipped and fell, pt is a hospice patient.

## 2016-03-29 ENCOUNTER — Other Ambulatory Visit: Payer: Self-pay | Admitting: Ophthalmology

## 2016-03-29 DIAGNOSIS — H53462 Homonymous bilateral field defects, left side: Secondary | ICD-10-CM

## 2016-04-04 ENCOUNTER — Telehealth: Payer: Self-pay | Admitting: *Deleted

## 2016-04-04 DIAGNOSIS — C259 Malignant neoplasm of pancreas, unspecified: Secondary | ICD-10-CM

## 2016-04-04 MED ORDER — FENTANYL 75 MCG/HR TD PT72: 75.0000 ug | MEDICATED_PATCH | TRANSDERMAL | Status: AC

## 2016-04-04 NOTE — Telephone Encounter (Signed)
rx faxed

## 2016-04-05 ENCOUNTER — Ambulatory Visit: Payer: Medicare Other

## 2016-04-05 ENCOUNTER — Telehealth: Payer: Self-pay

## 2016-04-05 NOTE — Telephone Encounter (Signed)
Crystal with Hospice called to report patient is not eating or drinking well as of now. Patient is not able to take Metformin regularly due to inability to swallow medication. Crystal states patient's wife is confused on what unit of Lantus to give patient each day. Discussed Lantus dosage and when patient would need to increase and decrease units as stated in last OV with Crystal. Crystal states she will advise/educate patient's wife on dosage of Lantus.   Crystal just wanted to make you aware, and if see if you have any recommendations or wanted to discontinue Metformin use? Crystal states patient's blood sugars are running in the 130's even when patient is unable to take the Metformin.   Please advise and if any changes Crystal would like a call back at [phone]

## 2016-04-06 NOTE — Telephone Encounter (Signed)
Correct number is [phone] sorry

## 2016-04-06 NOTE — Telephone Encounter (Signed)
Crystal from Hospice states she went over Lantus and glucose control regimen with patient's wife. Will stop the Metformin and allow them to adjust Lantus dose according to every 3rd day average of FBS (above 150 add 1 unit - below 100 subtract 1 unit). States sugars have been around 109-130. Crystal will see them today. States Mr. Nordan is in more significant pain and not able to help with glucose testing. Crystal will call report of condition and/or contact his oncologist.

## 2016-04-17 ENCOUNTER — Telehealth: Payer: Self-pay | Admitting: *Deleted

## 2016-04-17 DIAGNOSIS — R531 Weakness: Secondary | ICD-10-CM | POA: Diagnosis not present

## 2016-04-17 NOTE — Telephone Encounter (Signed)
Patient transferring to [place] for end of life care

## 2016-04-19 ENCOUNTER — Telehealth: Payer: Self-pay | Admitting: *Deleted

## 2016-04-19 NOTE — Telephone Encounter (Signed)
Called to report that patient passed away this morning at 6:14 AM

## 2016-04-30 DEATH — deceased

## 2016-06-03 IMAGING — CR DG THORACIC SPINE 3V
1 series · 3 of 3 positions shown · non-contrast
Comparison: PET-CT study dated February 27, 2016

CLINICAL DATA: Upper back pain, generalized weakness, history of
metastatic pancreatic malignancy, history of arthritis

EXAM:
THORACIC SPINE - 3 VIEWS

[Series 1: view not recorded · 0.14mm/px · 3 of 3 slices shown]
[im 1/3]
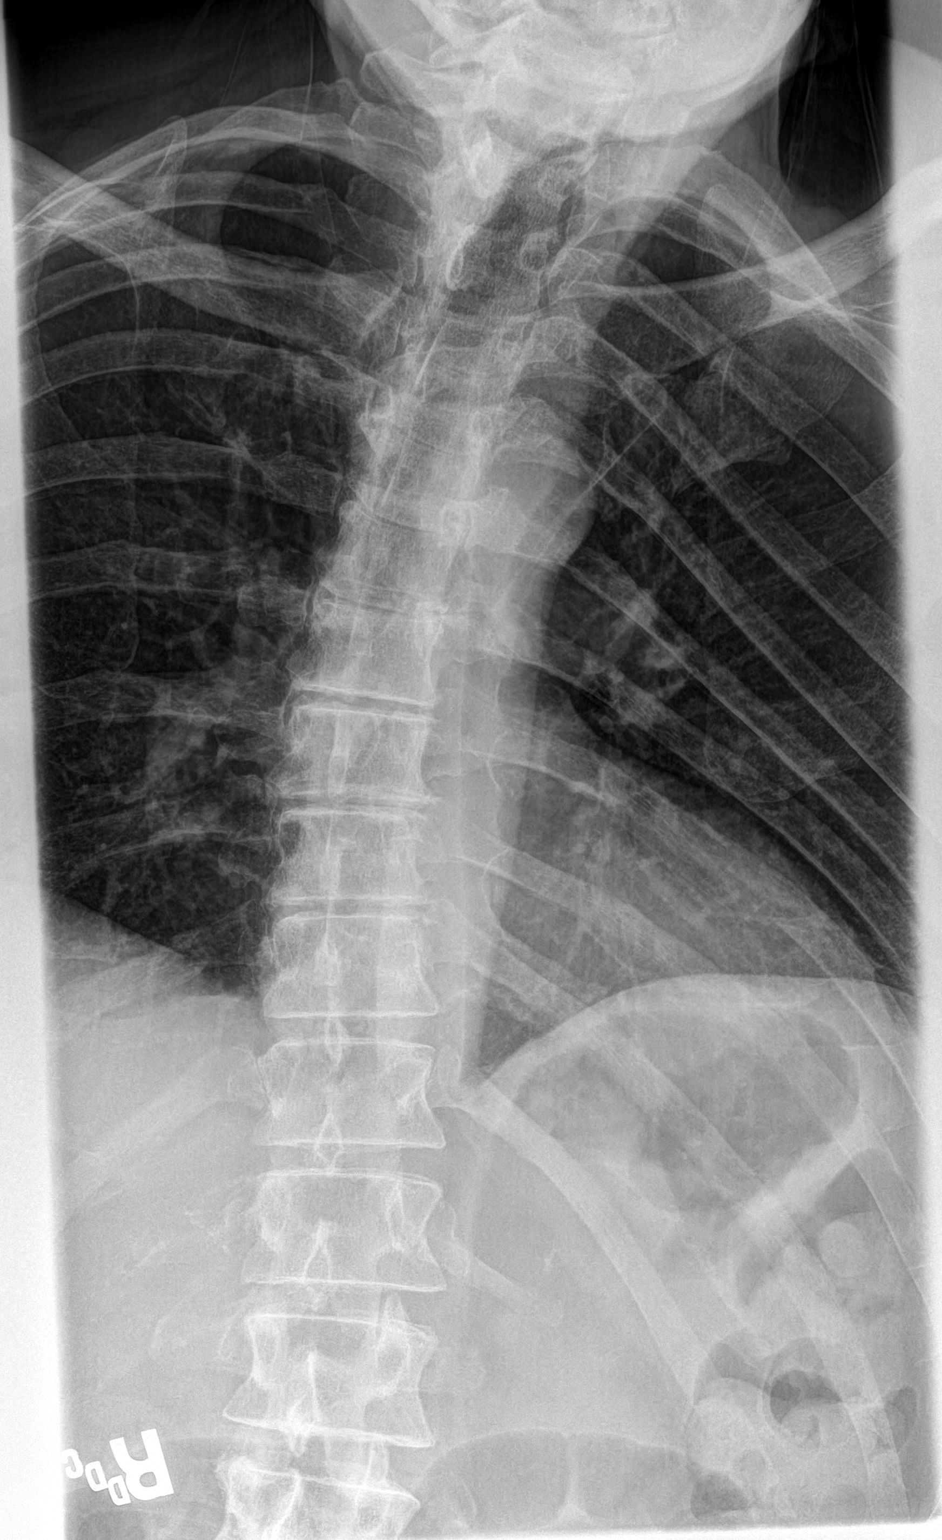
[im 2/3]
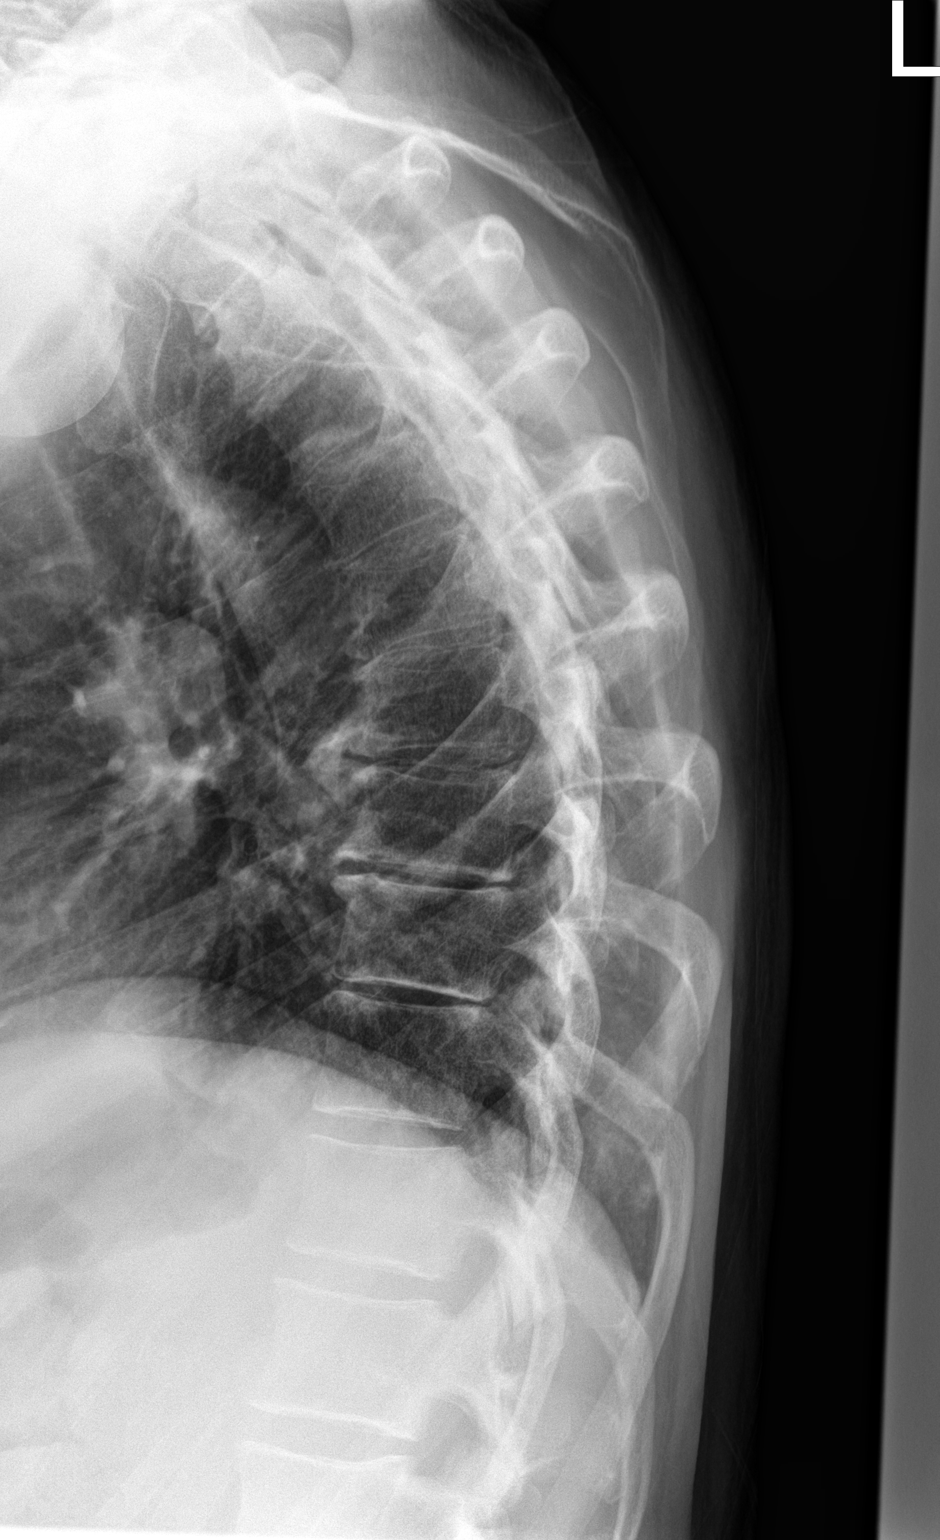
[im 3/3]
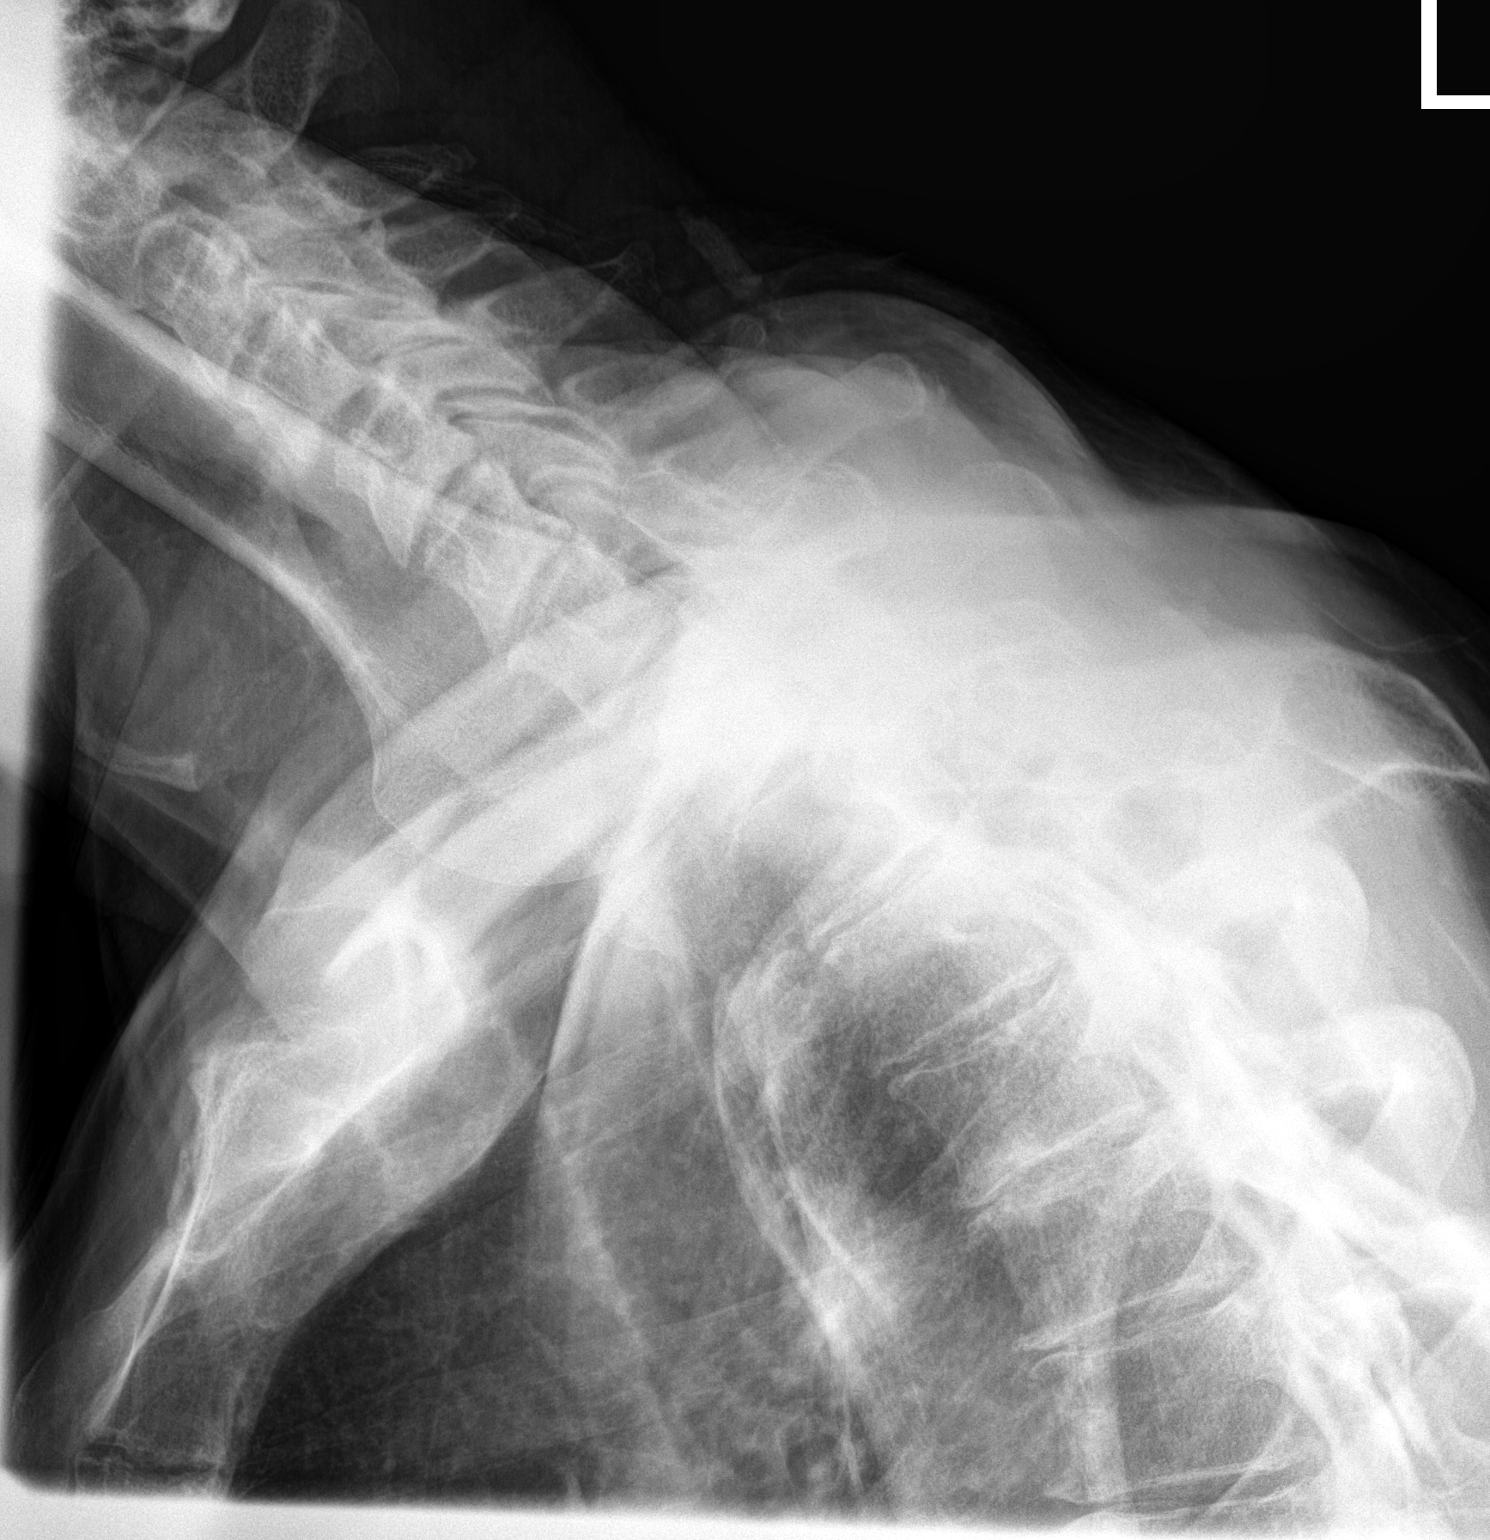

[3 of 3 positions shown; findings below may reference images not displayed]

FINDINGS: There is curvature of upper thoracic spine with the convexity toward
the right. This is stable. The vertebral bodies are preserved in
height. The pedicles are intact. There are no abnormal paravertebral
soft tissue densities. There is mild degenerative disc space
narrowing in the lower thoracic spine.
IMPRESSION: There is no radiographic evidence of metastatic disease to the
thoracic spine. There is stable mild curvature convex toward the
right centered in the mid thoracic spine. There is mild multilevel
degenerative disc disease in the lower thoracic spine.

## 2016-06-05 IMAGING — CT CT HEAD W/O CM
3 series · 16 of 30 positions shown, 18 images · non-contrast
Comparison: None.

CLINICAL DATA: Fall in the bathtub, hit back of the head on [REDACTED],
confusion

EXAM:
CT HEAD WITHOUT CONTRAST
TECHNIQUE: Contiguous axial images were obtained from the base of the skull
through the vertex without intravenous contrast.

[Series 2: head wo · axial · 0.42mm/px · z∈[+1382,+1476]mm · 4 of 33 slices shown]
[im 7/33  brain]
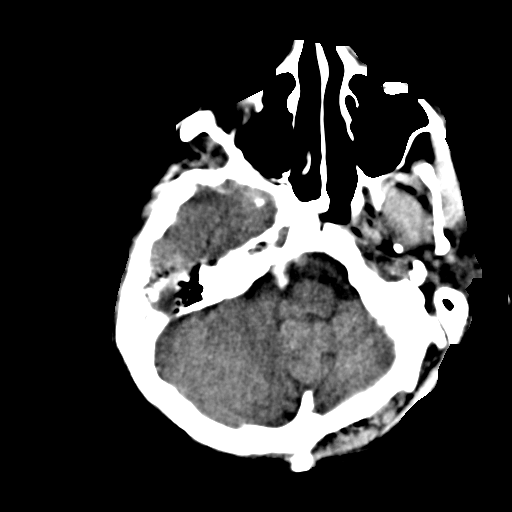
[im 13/33  brain]
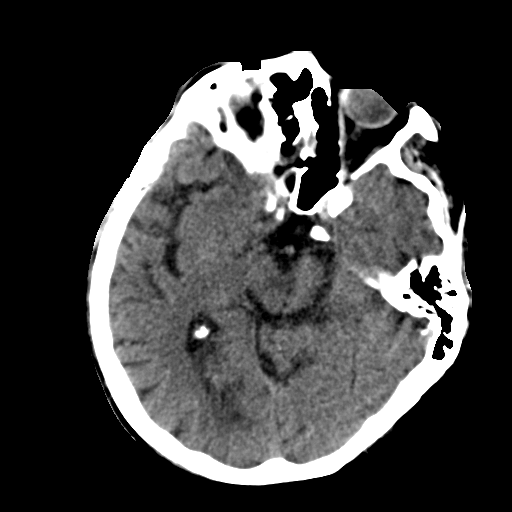
[im 20/33  brain]
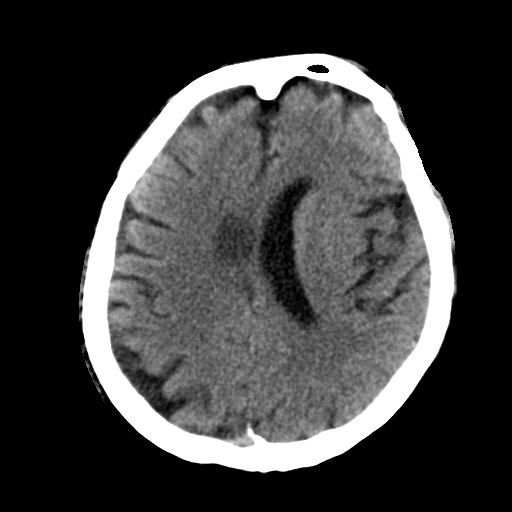
[im 26/33  brain]
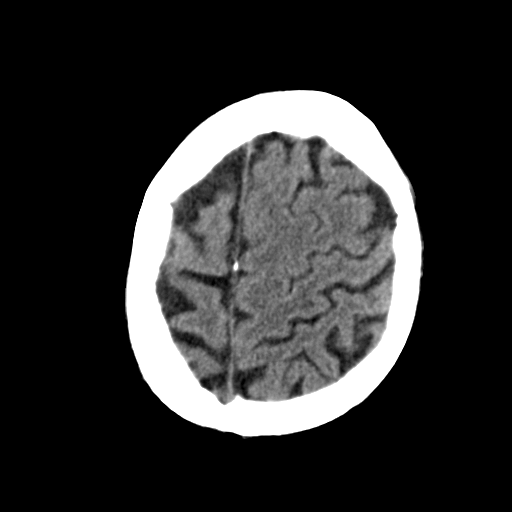

[Series 4: head wo recon · axial · 0.39mm/px · z∈[+1407,+1505]mm · 5 of 36 slices shown, 7 images]
[im 6/36  brain]
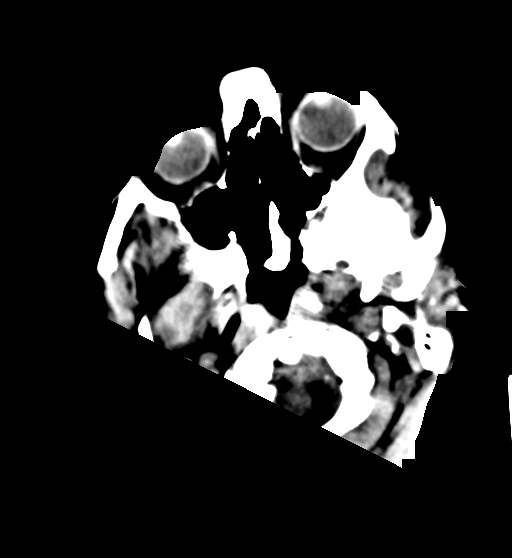
[im 6/36  bone]
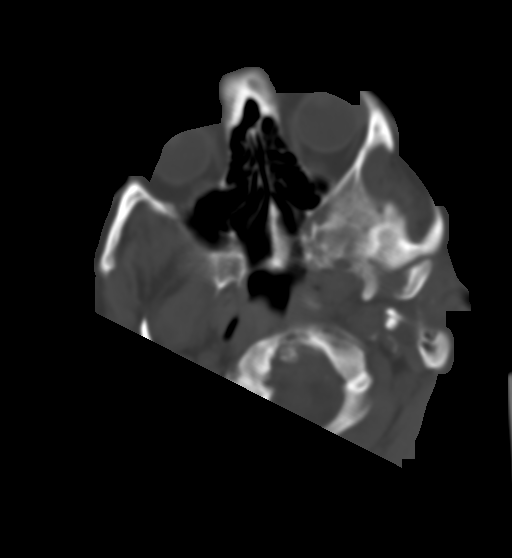
[im 12/36  brain]
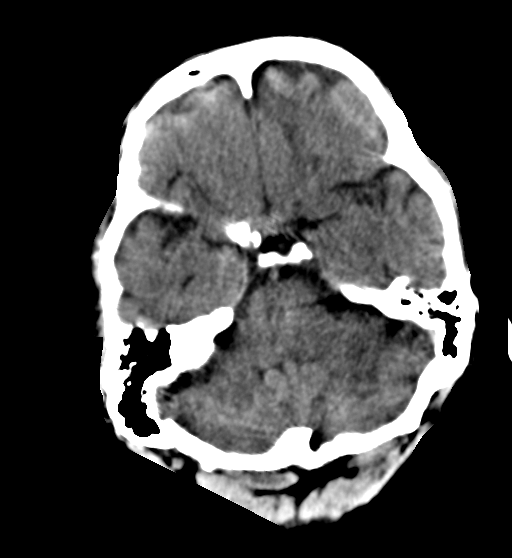
[im 18/36  brain]
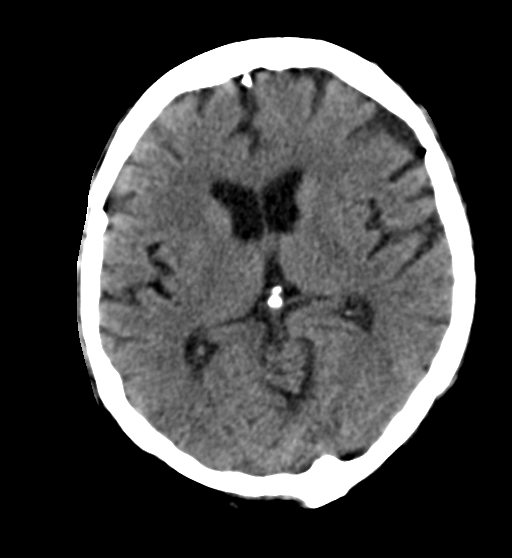
[im 24/36  brain]
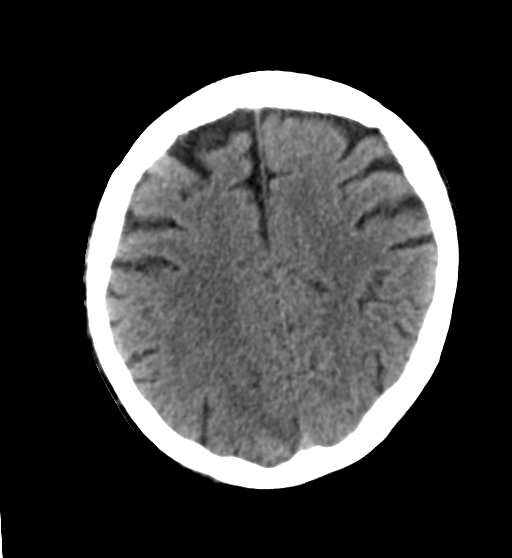
[im 30/36  brain]
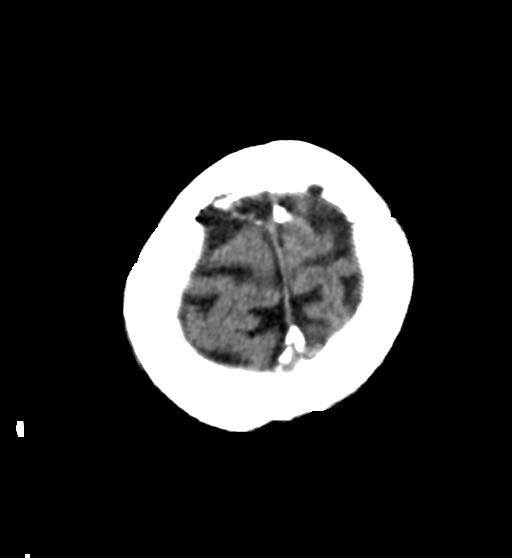
[im 30/36  bone]
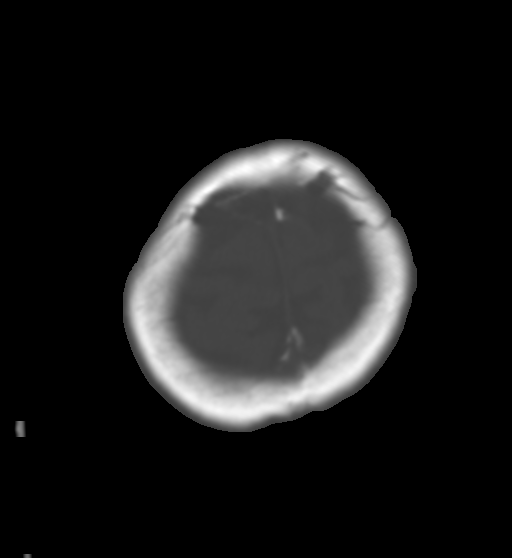

[Series 5: head bone recon · axial · 0.39mm/px · z∈[+1405,+1520]mm · 7 of 86 slices shown]
[im 6/86  bone]
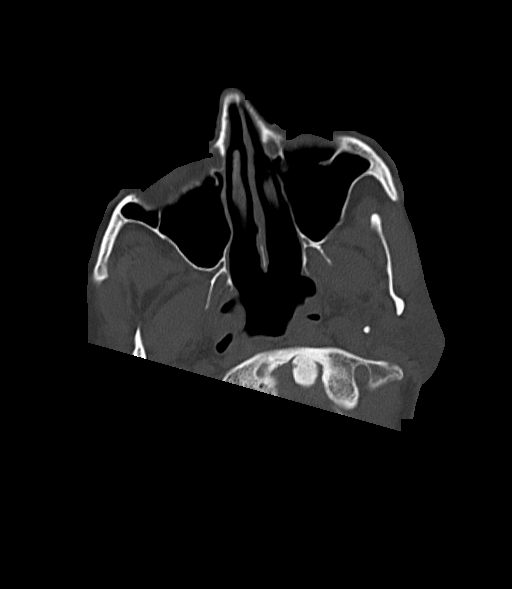
[im 16/86  bone]
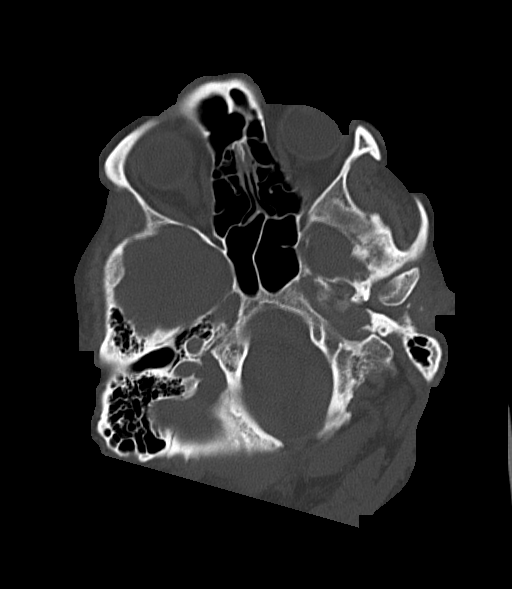
[im 27/86  bone]
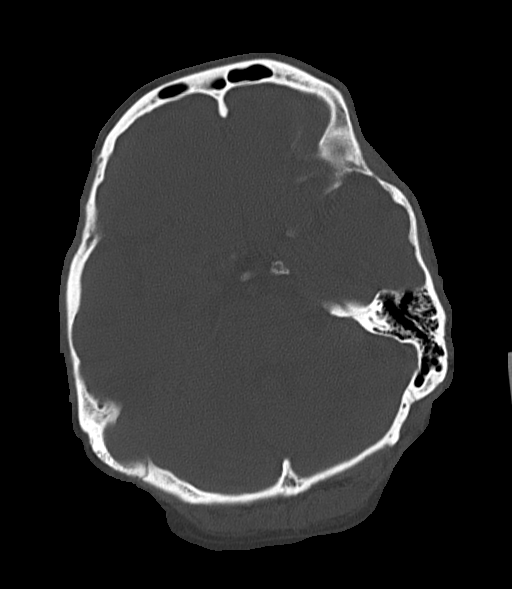
[im 38/86  bone]
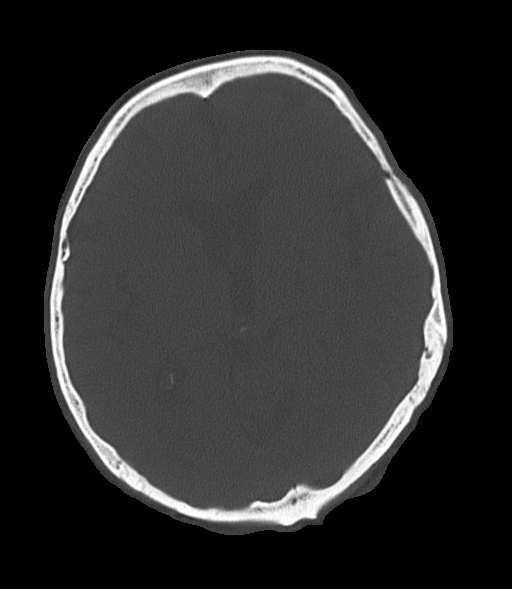
[im 48/86  bone]
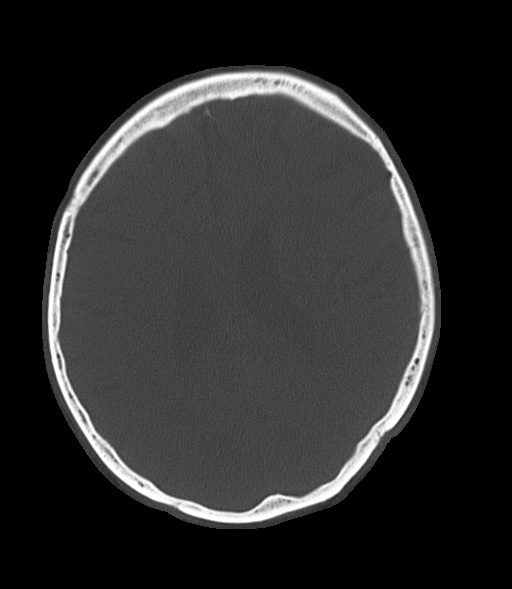
[im 59/86  bone]
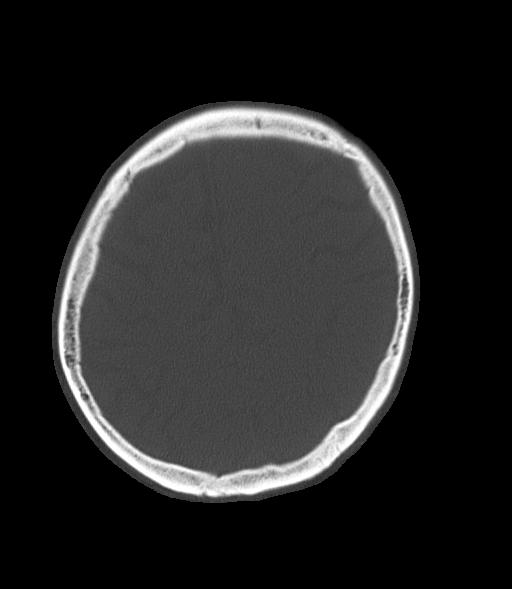
[im 70/86  bone]
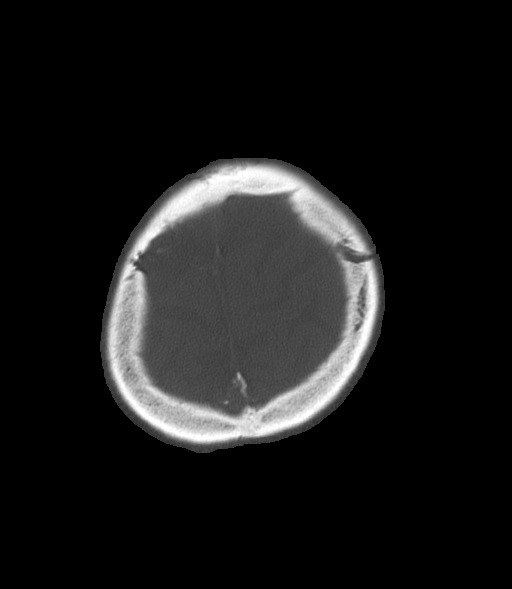

[16 of 30 positions shown; findings below may reference images not displayed]

FINDINGS: Brain: No intracranial hemorrhage, mass effect or midline shift. The
patient's head is tilted in the scan are. Mild cerebral atrophy.
Mild periventricular white matter decreased attenuation probable due
to chronic small vessel ischemic changes. No definite acute cortical
infarction. No mass lesion is noted on this unenhanced scan.

Vascular: Mild atherosclerotic calcifications of carotid siphon.

Skull: No skull fracture is noted.

Sinuses/Orbits: There is mild nodular mucosal thickening lateral
aspect of the left maxillary sinus probable mucous retention cyst.
The mastoid air cells are unremarkable.

Other: None
IMPRESSION: No intracranial hemorrhage, mass effect or midline shift. Mild
cerebral atrophy. Mild periventricular white matter decreased
attenuation probable due to chronic small vessel ischemic changes.
No skull fracture. No definite acute cortical infarction.
Atherosclerotic calcifications of carotid siphon.

## 2017-02-22 ENCOUNTER — Telehealth: Payer: Self-pay | Admitting: Family Medicine

## 2017-04-01 ENCOUNTER — Telehealth: Payer: Self-pay | Admitting: Family Medicine

## 2017-04-01 NOTE — Telephone Encounter (Signed)
Mailbox full Called Pt to schedule AWV with [facility] - knb

## 2017-04-11 ENCOUNTER — Telehealth: Payer: Self-pay | Admitting: Family Medicine

## 2017-04-11 NOTE — Telephone Encounter (Signed)
Called Pt to schedule AWV with [facility] - knb °
